# Patient Record
Sex: Female | Born: 1970
Health system: Southern US, Community
[De-identification: ages and names within clinical notes are randomized; demographics above are authoritative.]

## PROBLEM LIST (undated history)

## (undated) DIAGNOSIS — B019 Varicella without complication: Secondary | ICD-10-CM

## (undated) DIAGNOSIS — R42 Dizziness and giddiness: Secondary | ICD-10-CM

## (undated) DIAGNOSIS — E559 Vitamin D deficiency, unspecified: Secondary | ICD-10-CM

## (undated) DIAGNOSIS — E669 Obesity, unspecified: Secondary | ICD-10-CM

## (undated) HISTORY — DX: Obesity, unspecified: E66.9

## (undated) HISTORY — DX: Dizziness and giddiness: R42

## (undated) HISTORY — DX: Vitamin D deficiency, unspecified: E55.9

## (undated) HISTORY — DX: Varicella without complication: B01.9

---
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
## 1996-08-09 HISTORY — PX: CHOLECYSTECTOMY: SHX55

## 2008-08-09 HISTORY — PX: DILATION AND CURETTAGE OF UTERUS: SHX78

## 2009-08-09 HISTORY — PX: DILATION AND CURETTAGE OF UTERUS: SHX78

## 2015-12-25 ENCOUNTER — Ambulatory Visit (INDEPENDENT_AMBULATORY_CARE_PROVIDER_SITE_OTHER): Payer: 59 | Admitting: Family Medicine

## 2015-12-25 ENCOUNTER — Encounter: Payer: Self-pay | Admitting: Family Medicine

## 2015-12-25 VITALS — BP 130/82 | HR 60 | Temp 98.2°F | Ht 64.0 in | Wt 212.5 lb

## 2015-12-25 DIAGNOSIS — R6889 Other general symptoms and signs: Secondary | ICD-10-CM | POA: Diagnosis not present

## 2015-12-25 DIAGNOSIS — Z0001 Encounter for general adult medical examination with abnormal findings: Secondary | ICD-10-CM | POA: Diagnosis not present

## 2015-12-25 DIAGNOSIS — M25571 Pain in right ankle and joints of right foot: Secondary | ICD-10-CM | POA: Diagnosis not present

## 2015-12-25 DIAGNOSIS — R21 Rash and other nonspecific skin eruption: Secondary | ICD-10-CM

## 2015-12-25 DIAGNOSIS — E669 Obesity, unspecified: Secondary | ICD-10-CM | POA: Diagnosis not present

## 2015-12-25 NOTE — Patient Instructions (Signed)
We will call with your referrals (nutrition and dermatology).  Follow up annually or sooner if needed.  Take care  Dr. Lacinda Axon  Health Maintenance, Female Adopting a healthy lifestyle and getting preventive care can go a long way to promote health and wellness. Talk with your health care provider about what schedule of regular examinations is right for you. This is a good chance for you to check in with your provider about disease prevention and staying healthy. In between checkups, there are plenty of things you can do on your own. Experts have done a lot of research about which lifestyle changes and preventive measures are most likely to keep you healthy. Ask your health care provider for more information. WEIGHT AND DIET  Eat a healthy diet  Be sure to include plenty of vegetables, fruits, low-fat dairy products, and lean protein.  Do not eat a lot of foods high in solid fats, added sugars, or salt.  Get regular exercise. This is one of the most important things you can do for your health.  Most adults should exercise for at least 150 minutes each week. The exercise should increase your heart rate and make you sweat (moderate-intensity exercise).  Most adults should also do strengthening exercises at least twice a week. This is in addition to the moderate-intensity exercise.  Maintain a healthy weight  Body mass index (BMI) is a measurement that can be used to identify possible weight problems. It estimates body fat based on height and weight. Your health care provider can help determine your BMI and help you achieve or maintain a healthy weight.  For females 34 years of age and older:   A BMI below 18.5 is considered underweight.  A BMI of 18.5 to 24.9 is normal.  A BMI of 25 to 29.9 is considered overweight.  A BMI of 30 and above is considered obese.  Watch levels of cholesterol and blood lipids  You should start having your blood tested for lipids and cholesterol at 43  years of age, then have this test every 5 years.  You may need to have your cholesterol levels checked more often if:  Your lipid or cholesterol levels are high.  You are older than 43 years of age.  You are at high risk for heart disease.  CANCER SCREENING   Lung Cancer  Lung cancer screening is recommended for adults 64-58 years old who are at high risk for lung cancer because of a history of smoking.  A yearly low-dose CT scan of the lungs is recommended for people who:  Currently smoke.  Have quit within the past 15 years.  Have at least a 30-pack-year history of smoking. A pack year is smoking an average of one pack of cigarettes a day for 1 year.  Yearly screening should continue until it has been 15 years since you quit.  Yearly screening should stop if you develop a health problem that would prevent you from having lung cancer treatment.  Breast Cancer  Practice breast self-awareness. This means understanding how your breasts normally appear and feel.  It also means doing regular breast self-exams. Let your health care provider know about any changes, no matter how small.  If you are in your 20s or 30s, you should have a clinical breast exam (CBE) by a health care provider every 1-3 years as part of a regular health exam.  If you are 5 or older, have a CBE every year. Also consider having a breast X-ray (mammogram)  every year.  If you have a family history of breast cancer, talk to your health care provider about genetic screening.  If you are at high risk for breast cancer, talk to your health care provider about having an MRI and a mammogram every year.  Breast cancer gene (BRCA) assessment is recommended for women who have family members with BRCA-related cancers. BRCA-related cancers include:  Breast.  Ovarian.  Tubal.  Peritoneal cancers.  Results of the assessment will determine the need for genetic counseling and BRCA1 and BRCA2 testing. Cervical  Cancer Your health care provider may recommend that you be screened regularly for cancer of the pelvic organs (ovaries, uterus, and vagina). This screening involves a pelvic examination, including checking for microscopic changes to the surface of your cervix (Pap test). You may be encouraged to have this screening done every 3 years, beginning at age 71.  For women ages 27-65, health care providers may recommend pelvic exams and Pap testing every 3 years, or they may recommend the Pap and pelvic exam, combined with testing for human papilloma virus (HPV), every 5 years. Some types of HPV increase your risk of cervical cancer. Testing for HPV may also be done on women of any age with unclear Pap test results.  Other health care providers may not recommend any screening for nonpregnant women who are considered low risk for pelvic cancer and who do not have symptoms. Ask your health care provider if a screening pelvic exam is right for you.  If you have had past treatment for cervical cancer or a condition that could lead to cancer, you need Pap tests and screening for cancer for at least 20 years after your treatment. If Pap tests have been discontinued, your risk factors (such as having a new sexual partner) need to be reassessed to determine if screening should resume. Some women have medical problems that increase the chance of getting cervical cancer. In these cases, your health care provider may recommend more frequent screening and Pap tests. Colorectal Cancer  This type of cancer can be detected and often prevented.  Routine colorectal cancer screening usually begins at 43 years of age and continues through 43 years of age.  Your health care provider may recommend screening at an earlier age if you have risk factors for colon cancer.  Your health care provider may also recommend using home test kits to check for hidden blood in the stool.  A small camera at the end of a tube can be used to  examine your colon directly (sigmoidoscopy or colonoscopy). This is done to check for the earliest forms of colorectal cancer.  Routine screening usually begins at age 79.  Direct examination of the colon should be repeated every 5-10 years through 43 years of age. However, you may need to be screened more often if early forms of precancerous polyps or small growths are found. Skin Cancer  Check your skin from head to toe regularly.  Tell your health care provider about any new moles or changes in moles, especially if there is a change in a mole's shape or color.  Also tell your health care provider if you have a mole that is larger than the size of a pencil eraser.  Always use sunscreen. Apply sunscreen liberally and repeatedly throughout the day.  Protect yourself by wearing long sleeves, pants, a wide-brimmed hat, and sunglasses whenever you are outside. HEART DISEASE, DIABETES, AND HIGH BLOOD PRESSURE   High blood pressure causes heart disease and  increases the risk of stroke. High blood pressure is more likely to develop in:  People who have blood pressure in the high end of the normal range (130-139/85-89 mm Hg).  People who are overweight or obese.  People who are African American.  If you are 61-12 years of age, have your blood pressure checked every 3-5 years. If you are 49 years of age or older, have your blood pressure checked every year. You should have your blood pressure measured twice--once when you are at a hospital or clinic, and once when you are not at a hospital or clinic. Record the average of the two measurements. To check your blood pressure when you are not at a hospital or clinic, you can use:  An automated blood pressure machine at a pharmacy.  A home blood pressure monitor.  If you are between 72 years and 80 years old, ask your health care provider if you should take aspirin to prevent strokes.  Have regular diabetes screenings. This involves taking a  blood sample to check your fasting blood sugar level.  If you are at a normal weight and have a low risk for diabetes, have this test once every three years after 43 years of age.  If you are overweight and have a high risk for diabetes, consider being tested at a younger age or more often. PREVENTING INFECTION  Hepatitis B  If you have a higher risk for hepatitis B, you should be screened for this virus. You are considered at high risk for hepatitis B if:  You were born in a country where hepatitis B is common. Ask your health care provider which countries are considered high risk.  Your parents were born in a high-risk country, and you have not been immunized against hepatitis B (hepatitis B vaccine).  You have HIV or AIDS.  You use needles to inject street drugs.  You live with someone who has hepatitis B.  You have had sex with someone who has hepatitis B.  You get hemodialysis treatment.  You take certain medicines for conditions, including cancer, organ transplantation, and autoimmune conditions. Hepatitis C  Blood testing is recommended for:  Everyone born from [birth year] through 1963.  Anyone with known risk factors for hepatitis C. Sexually transmitted infections (STIs)  You should be screened for sexually transmitted infections (STIs) including gonorrhea and chlamydia if:  You are sexually active and are younger than 43 years of age.  You are older than 43 years of age and your health care provider tells you that you are at risk for this type of infection.  Your sexual activity has changed since you were last screened and you are at an increased risk for chlamydia or gonorrhea. Ask your health care provider if you are at risk.  If you do not have HIV, but are at risk, it may be recommended that you take a prescription medicine daily to prevent HIV infection. This is called pre-exposure prophylaxis (PrEP). You are considered at risk if:  You are sexually active and do  not regularly use condoms or know the HIV status of your partner(s).  You take drugs by injection.  You are sexually active with a partner who has HIV. Talk with your health care provider about whether you are at high risk of being infected with HIV. If you choose to begin PrEP, you should first be tested for HIV. You should then be tested every 3 months for as long as you are taking PrEP.  PREGNANCY   If you are premenopausal and you may become pregnant, ask your health care provider about preconception counseling.  If you may become pregnant, take 400 to 800 micrograms (mcg) of folic acid every day.  If you want to prevent pregnancy, talk to your health care provider about birth control (contraception). OSTEOPOROSIS AND MENOPAUSE   Osteoporosis is a disease in which the bones lose minerals and strength with aging. This can result in serious bone fractures. Your risk for osteoporosis can be identified using a bone density scan.  If you are 49 years of age or older, or if you are at risk for osteoporosis and fractures, ask your health care provider if you should be screened.  Ask your health care provider whether you should take a calcium or vitamin D supplement to lower your risk for osteoporosis.  Menopause may have certain physical symptoms and risks.  Hormone replacement therapy may reduce some of these symptoms and risks. Talk to your health care provider about whether hormone replacement therapy is right for you.  HOME CARE INSTRUCTIONS   Schedule regular health, dental, and eye exams.  Stay current with your immunizations.   Do not use any tobacco products including cigarettes, chewing tobacco, or electronic cigarettes.  If you are pregnant, do not drink alcohol.  If you are breastfeeding, limit how much and how often you drink alcohol.  Limit alcohol intake to no more than 1 drink per day for nonpregnant women. One drink equals 12 ounces of beer, 5 ounces of wine, or 1  ounces of hard liquor.  Do not use street drugs.  Do not share needles.  Ask your health care provider for help if you need support or information about quitting drugs.  Tell your health care provider if you often feel depressed.  Tell your health care provider if you have ever been abused or do not feel safe at home.   This information is not intended to replace advice given to you by your health care provider. Make sure you discuss any questions you have with your health care provider.   Document Released: 02/08/2011 Document Revised: 08/16/2014 Document Reviewed: 06/27/2013 Elsevier Interactive Patient Education [employer].

## 2015-12-25 NOTE — Progress Notes (Signed)
Pre visit review using our clinic review tool, if applicable. No additional management support is needed unless otherwise documented below in the visit note. 

## 2015-12-26 ENCOUNTER — Encounter: Payer: Self-pay | Admitting: Family Medicine

## 2015-12-26 DIAGNOSIS — R21 Rash and other nonspecific skin eruption: Secondary | ICD-10-CM | POA: Insufficient documentation

## 2015-12-26 DIAGNOSIS — M25579 Pain in unspecified ankle and joints of unspecified foot: Secondary | ICD-10-CM | POA: Insufficient documentation

## 2015-12-26 DIAGNOSIS — Z0001 Encounter for general adult medical examination with abnormal findings: Secondary | ICD-10-CM | POA: Insufficient documentation

## 2015-12-26 NOTE — Progress Notes (Signed)
Subjective:  Patient ID: Nancy Donovan, female    DOB: 06-Jul-1971  Age: 45 y.o. MRN: [id number]  CC: Establish care, Rash, Ankle pain, Discuss weight  HPI Nancy Donovan is a 45 y.o. female presents to the clinic today to Establish care. Additional concerns are outlined below.  Preventative Healthcare  Pap smear: Up-to-date. Performed by [facility] in 2016.  Mammogram: Up-to-date. Done this month. Has not received information back.  Colonoscopy: N/A.  Immunizations  Tetanus - up-to-date.  Pneumococcal - N/A  Flu - N/A at this time.  Zoster - N/A  Labs: Has had recent screening labs at her OB/GYN's office. Need records.  Exercise: Trying to exercise but is finding it difficult due to schedule/children.  Alcohol use: See below.  Smoking/tobacco use: Nonsmoker.  Regular dental exams: Yes.   Wears seat belt: Yes.  Ankle pain - right  Has been going on for several weeks.   Pain is located just posterior to the lateral malleolus.  She states that it feels "tight".  She has been stretching with some improvement.  No known inciting factor.  Exacerbated by activity.  No other interventions tried.   Rash  Patient reports recent development of "red patches" on her arms.  She states that it happens when they're exposed to water.  No associated itching.  No new exposures or changes in skin care products.  She states that it is annoying and she wanted to go away.  She does note that her skin has been dry.  No known relieving factors other than not exposing to water.  PMH, Surgical Hx, Family Hx, Social History reviewed and updated as below.  Past Medical History  Diagnosis Date  . Chicken pox    Past Surgical History  Procedure Laterality Date  . Eye surgery  1978  . Cesarean section  2008  . Cesarean section  2011    repeat c-seaction  . Dilation and curettage of uterus  2010  . Dilation and curettage of uterus  2011  . Cholecystectomy  1998    Family History  Problem Relation Age of Onset  . Hypertension Mother   . Hypertension Father   . Diabetes Mother      Social History  Substance Use Topics  . Smoking status: Never Smoker   . Smokeless tobacco: Never Used  . Alcohol Use: 0.0 - 0.6 oz/week    0-1 Standard drinks or equivalent per week   Review of Systems  Musculoskeletal:       Ankle pain.  Skin: Positive for rash.  Psychiatric/Behavioral:       Emotional (crys often).  All other systems reviewed and are negative.  Objective:   Today's Vitals: BP 130/82 mmHg  Pulse 60  Temp(Src) 98.2 F (36.8 C) (Oral)  Ht  (1.626 m)  Wt 212 lb 8 oz (96.389 kg)  BMI 36.46 kg/m2  SpO2 97%  LMP 12/08/2015 (Approximate)  Physical Exam  Constitutional: She is oriented to person, place, and time. She appears well-developed. No distress.  HENT:  Head: Normocephalic and atraumatic.  Mouth/Throat: Oropharynx is clear and moist.  Eyes: Conjunctivae are normal. No scleral icterus.  Neck: Normal range of motion. Neck supple.  Cardiovascular: Normal rate and regular rhythm.   No murmur heard. Pulmonary/Chest: Effort normal and breath sounds normal. She has no wheezes. She has no rales.  Abdominal: Soft. She exhibits no distension. There is no tenderness. There is no rebound and no guarding.  Musculoskeletal:  Right ankle - no areas  of tenderness. Normal range of motion. No swelling or effusion.  Neurological: She is alert and oriented to person, place, and time.  Skin:  Arms and legs with dry skin. Evidence of sun damage noted on the forearms. No discrete rash.  Psychiatric: She has a normal mood and affect.  Vitals reviewed.  Assessment & Plan:   Problem List Items Addressed This Visit    Rash    Patient was reported red areas/patches on her upper arms. Other than dry skin and evidence of sun damage, no rash was noted today. Sending to dermatology per patient request.      Relevant Orders   Ambulatory  referral to Dermatology   Encounter for preventative adult health care exam with abnormal findings - Primary    Pap smear up-to-date. Mammogram up-to-date. Tetanus up-to-date. Has had recent screening labs. Discussed her weight today as she wants to lose weight and is finding it difficult. Advised high-protein low-carb diet and regular exercise. Discussed nutrition referral and she would like to proceed with this.      Ankle pain    Exam unremarkable. Advised supportive care and good supportive shoes. No need for further intervention at this time.       Other Visit Diagnoses    Obesity (BMI 30-39.9)        Relevant Orders    Amb ref to Medical Nutrition Therapy-MNT      Follow-up: Annually or sooner if needed.  Everlene OtherJayce Samai Corea DO [facility] [place]

## 2015-12-26 NOTE — Assessment & Plan Note (Signed)
Patient was reported red areas/patches on her upper arms. Other than dry skin and evidence of sun damage, no rash was noted today. Sending to dermatology per patient request.

## 2015-12-26 NOTE — Assessment & Plan Note (Signed)
Exam unremarkable. Advised supportive care and good supportive shoes. No need for further intervention at this time.

## 2015-12-26 NOTE — Assessment & Plan Note (Signed)
Pap smear up-to-date. Mammogram up-to-date. Tetanus up-to-date. Has had recent screening labs. Discussed her weight today as she wants to lose weight and is finding it difficult. Advised high-protein low-carb diet and regular exercise. Discussed nutrition referral and she would like to proceed with this.

## 2016-02-05 ENCOUNTER — Encounter: Payer: Self-pay | Admitting: Dietician

## 2016-02-05 ENCOUNTER — Encounter: Payer: 59 | Attending: Family Medicine | Admitting: Dietician

## 2016-02-05 VITALS — Ht 63.0 in | Wt 213.0 lb

## 2016-02-05 DIAGNOSIS — E669 Obesity, unspecified: Secondary | ICD-10-CM

## 2016-02-05 NOTE — Progress Notes (Signed)
Medical Nutrition Therapy: Visit start time: 1330  end time: 1430  Assessment:  Diagnosis: obesity Past medical history: vertigo Psychosocial issues/ stress concerns: none Preferred learning method:  . Hands-on  Current weight: 213lbs  Height: 5'3" Medications, supplements: taking Meclizine for vertigo Progress and evaluation: Patient reports participating in weight watchers program in the past, with success (in her 30s)        Now trying again to lose weight, and having more difficulty. Has phone app to track exercise and intake.    Physical activity: none  Dietary Intake:  Usual eating pattern includes 3 meals and 1-2 snacks per day. Dining out frequency: 2 meals per week.  Breakfast: peanut butter toast, apple; omelet with cheese; occasionally bigger meal on weekends. Diet mt dew, usually 1 per day.  Snack: sometimes peanut butter crackers or snack bar, cheese stick, fruit when available.  Lunch: sandwich with [place] and cheese, chips.  Snack:  usually none; occasionally apple or cheese stick, rarely sweets like cookies or ice cream. Supper: spaghetti, burgers or hot dogs, tacos, meat loaf with a starch, vegetable (kids wont eat veg), usually easy-to-cook meals.  Snack: occasionally popcorn on "movie night". sometimes cookie or ice cream after dinner.  Beverages: Diet Mt. Dew (1), Crystal light lemonade, sometimes 1/2-sweet tea, water  Nutrition Care Education: Topics covered: weight management Basic nutrition: basic food groups, appropriate nutrient balance, appropriate meal and snack schedule, general nutrition guidelines    Weight control: behavioral changes for weight loss: portion control, importance of low fat and low sugar foods, guide for 1400kcal daily intake Other lifestyle changes:  benefits of regular exercise, tracking food intake.  Nutritional Diagnosis:  [place]-3.3 Overweight/obesity As related to inactivity, excess calories.  As evidenced by patient  report.  Intervention: Instruction as noted above.   Set goals with patient input.  Education Materials given:  . Food lists/ Planning A Balanced Meal . Sample meal pattern/ menus: Quick and healthy Meal Ideas . Goals/ instructions  Learner/ who was taught:  . Patient   Level of understanding: Marland Kitchen. Verbalizes/ demonstrates competency  Demonstrated degree of understanding via:   Teach back Learning barriers: . None  Willingness to learn/ readiness for change: . Eager, change in progress  Monitoring and Evaluation:  Dietary intake, exercise, and body weight      follow up: 03/15/16

## 2016-02-05 NOTE — Patient Instructions (Signed)
   Plan balanced meals, including large vegetable portions, while controlling portions of starches and meats.   Track food intake using your phone app.; aim for about 1400 calories a day on average.   Resume some exercise -- start with a manageable time, such as 15 minutes, and gradually increase as your stamina increases.

## 2016-03-15 ENCOUNTER — Ambulatory Visit: Payer: 59 | Admitting: Dietician

## 2017-02-14 ENCOUNTER — Ambulatory Visit (INDEPENDENT_AMBULATORY_CARE_PROVIDER_SITE_OTHER): Payer: 59 | Admitting: Family Medicine

## 2017-02-14 ENCOUNTER — Encounter: Payer: Self-pay | Admitting: Family Medicine

## 2017-02-14 VITALS — BP 126/76 | HR 88 | Temp 98.2°F | Resp 16 | Ht 63.0 in | Wt 216.8 lb

## 2017-02-14 DIAGNOSIS — E669 Obesity, unspecified: Secondary | ICD-10-CM

## 2017-02-14 DIAGNOSIS — Z0000 Encounter for general adult medical examination without abnormal findings: Secondary | ICD-10-CM

## 2017-02-14 NOTE — Patient Instructions (Signed)

## 2017-02-15 ENCOUNTER — Encounter: Payer: Self-pay | Admitting: Family Medicine

## 2017-02-15 DIAGNOSIS — Z0000 Encounter for general adult medical examination without abnormal findings: Secondary | ICD-10-CM | POA: Insufficient documentation

## 2017-02-15 DIAGNOSIS — E669 Obesity, unspecified: Secondary | ICD-10-CM | POA: Insufficient documentation

## 2017-02-15 DIAGNOSIS — Z0001 Encounter for general adult medical examination with abnormal findings: Secondary | ICD-10-CM | POA: Insufficient documentation

## 2017-02-15 LAB — COMPREHENSIVE METABOLIC PANEL
ALBUMIN: 4.2 g/dL (ref 3.5–5.2)
ALK PHOS: 65 U/L (ref 39–117)
ALT: 20 U/L (ref 0–35)
AST: 13 U/L (ref 0–37)
BUN: 15 mg/dL (ref 6–23)
CALCIUM: 10.1 mg/dL (ref 8.4–10.5)
CO2: 29 mEq/L (ref 19–32)
Chloride: 101 mEq/L (ref 96–112)
Creatinine, Ser: 0.94 mg/dL (ref 0.40–1.20)
GFR: 68.72 mL/min (ref >60.00)
Glucose, Bld: 92 mg/dL (ref 70–99)
POTASSIUM: 4.1 meq/L (ref 3.5–5.1)
Sodium: 139 mEq/L (ref 135–145)
Total Bilirubin: 0.4 mg/dL (ref 0.2–1.2)
Total Protein: 7.5 g/dL (ref 6.0–8.3)

## 2017-02-15 LAB — CBC
HEMATOCRIT: 41 % (ref 36.0–46.0)
HEMOGLOBIN: 14.1 g/dL (ref 12.0–15.0)
MCHC: 34.3 g/dL (ref 30.0–36.0)
MCV: 86.5 fl (ref 78.0–100.0)
PLATELETS: 278 10*3/uL (ref 150.0–400.0)
RBC: 4.74 Mil/uL (ref 3.87–5.11)
RDW: 13 % (ref 11.5–15.5)
WBC: 8.1 10*3/uL (ref 4.0–10.5)

## 2017-02-15 LAB — LIPID PANEL
CHOLESTEROL: 155 mg/dL (ref 0–200)
HDL: 44.4 mg/dL (ref >39.00)
LDL Cholesterol: 79 mg/dL (ref 0–99)
NonHDL: 110.48
Total CHOL/HDL Ratio: 3
Triglycerides: 159 mg/dL — ABNORMAL HIGH (ref 0.0–149.0)
VLDL: 31.8 mg/dL (ref 0.0–40.0)

## 2017-02-15 LAB — TSH: TSH: 1.76 u[IU]/mL (ref 0.35–4.50)

## 2017-02-15 LAB — HEMOGLOBIN A1C: Hgb A1c MFr Bld: 5.2 % (ref 4.6–6.5)

## 2017-02-15 NOTE — Assessment & Plan Note (Signed)
Preventative health care up to date. Sees GYN regarding pap smears and mammogram. Lengthy discussion today about weight loss and dietary changes.

## 2017-02-15 NOTE — Progress Notes (Signed)
   Subjective:  Patient ID: Nancy Donovan, female    DOB: 22-Nov-1970  Age: 46 y.o. MRN: [id number]  CC: Annual exam  HPI Nancy Donovan is a 46 y.o. female presents to the clinic today for an annual physical exam.  Preventative Healthcare  Pap smear: Up to date.  Mammogram: Up to date.  Colonoscopy: Not indicated.   Immunizations: Up to date.   Labs: Screening labs today.  Exercise: Trying to exercise regularly.  Smoking/tobacco use: Former.  STD/HIV testing: Done previously.  PMH, Surgical Hx, Family Hx, Social History reviewed and updated as below.  Past Medical History:  Diagnosis Date  . Chicken pox   . Obesity (BMI 30-39.9)    Past Surgical History:  Procedure Laterality Date  . CESAREAN SECTION  2008  . CESAREAN SECTION  2011   repeat c-seaction  . CHOLECYSTECTOMY  1998  . DILATION AND CURETTAGE OF UTERUS  2010  . DILATION AND CURETTAGE OF UTERUS  2011  . EYE SURGERY  1978   Family History  Problem Relation Age of Onset  . Hypertension Mother   . Diabetes Mother   . Hypertension Father    Social History  Substance Use Topics  . Smoking status: Former Games developermoker  . Smokeless tobacco: Never Used  . Alcohol use 0.0 - 0.6 oz/week   Review of Systems  Musculoskeletal: Positive for back pain.  All other systems reviewed and are negative.  Objective:   Today's Vitals: BP 126/76   Pulse 88   Temp 98.2 F (36.8 C) (Oral)   Resp 16   Ht 5\' 3"  (1.6 m)   Wt 216 lb 12.8 oz (98.3 kg)   LMP 01/30/2017   SpO2 99%   BMI 38.40 kg/m   Physical Exam  Constitutional: She is oriented to person, place, and time. She appears well-developed and well-nourished. No distress.  HENT:  Head: Normocephalic and atraumatic.  Nose: Nose normal.  Mouth/Throat: Oropharynx is clear and moist. No oropharyngeal exudate.  Normal TM's bilaterally.   Eyes: Conjunctivae are normal. No scleral icterus.  Neck: Neck supple.  Cardiovascular: Normal rate and regular rhythm.   No  murmur heard. Pulmonary/Chest: Effort normal and breath sounds normal. She has no wheezes. She has no rales.  Abdominal: Soft. She exhibits no distension. There is no tenderness. There is no rebound and no guarding.  Musculoskeletal: Normal range of motion. She exhibits no edema.  Lymphadenopathy:    She has no cervical adenopathy.  Neurological: She is alert and oriented to person, place, and time.  Skin: Skin is warm and dry. No rash noted.  Psychiatric: She has a normal mood and affect.  Vitals reviewed.  Assessment & Plan:   Problem List Items Addressed This Visit      Other   Annual physical exam - Primary    Preventative health care up to date. Sees GYN regarding pap smears and mammogram. Lengthy discussion today about weight loss and dietary changes.      Relevant Orders   CBC   Hemoglobin A1c   Comprehensive metabolic panel   Lipid panel   TSH   Obesity (BMI 30-39.9)     Follow-up: Return in about 1 year (around 02/14/2018).  Everlene OtherJayce Toriann Spadoni DO [facility] [place]

## 2017-02-17 ENCOUNTER — Telehealth: Payer: Self-pay | Admitting: *Deleted

## 2017-02-17 NOTE — Telephone Encounter (Signed)
Patient notified of lab results see documentation on lab results

## 2017-02-17 NOTE — Telephone Encounter (Signed)
Pt has requested lab results  Pt contact [phone]

## 2017-06-22 ENCOUNTER — Encounter: Payer: Self-pay | Admitting: Family Medicine

## 2017-06-22 ENCOUNTER — Ambulatory Visit (INDEPENDENT_AMBULATORY_CARE_PROVIDER_SITE_OTHER): Payer: 59 | Admitting: Family Medicine

## 2017-06-22 VITALS — BP 110/76 | HR 78 | Temp 97.9°F | Wt 218.4 lb

## 2017-06-22 DIAGNOSIS — K649 Unspecified hemorrhoids: Secondary | ICD-10-CM | POA: Diagnosis not present

## 2017-06-22 MED ORDER — HYDROCORTISONE 2.5 % RE CREA: 1.0000 "application " | TOPICAL_CREAM | Freq: Two times a day (BID) | RECTAL | 0 refills | Status: DC

## 2017-06-22 NOTE — Patient Instructions (Signed)
It was a pleasure to see you today!  -Please increase fiber and water intake -Continue miralax as needed -Follow up if symptoms do not improve in one week or sooner if needed.   Hemorrhoids Hemorrhoids are swollen veins in and around the rectum or anus. Hemorrhoids can cause pain, itching, or bleeding. Most of the time, they do not cause serious problems. They usually get better with diet changes, lifestyle changes, and other home treatments. Follow these instructions at home: Eating and drinking  Eat foods that have fiber, such as whole grains, beans, nuts, fruits, and vegetables. Ask your doctor about taking products that have added fiber (fibersupplements).  Drink enough fluid to keep your pee (urine) clear or pale yellow. For Pain and Swelling  Take a warm-water bath (sitz bath) for 20 minutes to ease pain. Do this 3-4 times a day.  If directed, put ice on the painful area. It may be helpful to use ice between your warm baths. ? Put ice in a plastic bag. ? Place a towel between your skin and the bag. ? Leave the ice on for 20 minutes, 2-3 times a day. General instructions  Take over-the-counter and prescription medicines only as told by your doctor. ? Medicated creams and medicines that are inserted into the anus (suppositories) may be used or applied as told.  Exercise often.  Go to the bathroom when you have the urge to poop (to have a bowel movement). Do not wait.  Avoid pushing too hard (straining) when you poop.  Keep the butt area dry and clean. Use wet toilet paper or moist paper towels.  Do not sit on the toilet for a long time. Contact a doctor if:  You have any of these: ? Pain and swelling that do not get better with treatment or medicine. ? Bleeding that will not stop. ? Trouble pooping or you cannot poop. ? Pain or swelling outside the area of the hemorrhoids. This information is not intended to replace advice given to you by your health care provider.  Make sure you discuss any questions you have with your health care provider. Document Released: 05/04/2008 Document Revised: 01/01/2016 Document Reviewed: 04/09/2015 Elsevier Interactive Patient Education  [employer]2018 Elsevier Inc[employer]

## 2017-06-22 NOTE — Progress Notes (Signed)
Subjective:    Patient ID: Nancy Donovan, female    DOB: 07-Oct-1970, 46 y.o.   MRN: [id number]  HPI  Ms. Nancy Donovan is a 46 year old female who presents with a hemorrhoid that has been present for 6 days. This is a new problem. She reports recently changing her diet by eating more protein and avoiding carbohydrates which has contributed to constipation. She had recent constipation due to this change in her diet. She has started miralax 1/2 capful every other day which has improved her constipation and BMs are now soft and formed Associated itching and discomfort noted. No bleeding noted. No blood in her stool. Denies fever, night sweats, weight loss, abdominal pain, or pain with defecation present. Treatment with OTC preparation H has provided limited benefit.  Review of Systems  Constitutional: Negative for activity change, appetite change, chills, fatigue and fever.  Respiratory: Negative for cough, shortness of breath and wheezing.   Cardiovascular: Negative for chest pain and palpitations.  Gastrointestinal: Negative for abdominal pain, diarrhea, nausea and vomiting.       Hemorrhoid present  Musculoskeletal: Negative for myalgias.  Skin: Negative for rash.  Neurological: Negative for dizziness, light-headedness and headaches.   Past Medical History:  Diagnosis Date  . Chicken pox   . Obesity (BMI 30-39.9)      Social History   Socioeconomic History  . Marital status: Married    Spouse name: Not on file  . Number of children: Not on file  . Years of education: Not on file  . Highest education level: Not on file  Social Needs  . Financial resource strain: Not on file  . Food insecurity - worry: Not on file  . Food insecurity - inability: Not on file  . Transportation needs - medical: Not on file  . Transportation needs - non-medical: Not on file  Occupational History  . Not on file  Tobacco Use  . Smoking status: Former Games developermoker  . Smokeless tobacco: Never Used  Substance  and Sexual Activity  . Alcohol use: Yes    Alcohol/week: 0.0 - 0.6 oz  . Drug use: No  . Sexual activity: Yes    Partners: Male  Other Topics Concern  . Not on file  Social History Narrative  . Not on file    Past Surgical History:  Procedure Laterality Date  . CESAREAN SECTION  2008  . CESAREAN SECTION  2011   repeat c-seaction  . CHOLECYSTECTOMY  1998  . DILATION AND CURETTAGE OF UTERUS  2010  . DILATION AND CURETTAGE OF UTERUS  2011  . EYE SURGERY  1978    Family History  Problem Relation Age of Onset  . Hypertension Mother   . Diabetes Mother   . Hypertension Father     No Known Allergies  Current Outpatient Medications on File Prior to Visit  Medication Sig Dispense Refill  . fexofenadine (ALLEGRA) 30 MG tablet Take 30 mg 2 (two) times daily by mouth.    . meclizine (ANTIVERT) 25 MG tablet Take 25 mg by mouth 3 (three) times daily as needed for dizziness (takes 1/2 tab at a time).     No current facility-administered medications on file prior to visit.     BP 110/76   Pulse 78   Temp 97.9 F (36.6 C) (Oral)   Wt 218 lb 6.4 oz (99.1 kg)   LMP 06/21/2017   SpO2 95%   BMI 38.69 kg/m        Objective:  Physical Exam  Constitutional: She is oriented to person, place, and time. She appears well-developed and well-nourished.  Eyes: Pupils are equal, round, and reactive to light. No scleral icterus.  Neck: Neck supple.  Cardiovascular: Normal rate, regular rhythm and intact distal pulses.  Pulmonary/Chest: Effort normal and breath sounds normal. She has no wheezes. She has no rales.  Abdominal: Soft. Bowel sounds are normal. There is no tenderness.  Genitourinary:  Genitourinary Comments: External hemorrhoid noted; not thrombosed; no internal hemorrhoid palpated on DRE.  Lymphadenopathy:    She has no cervical adenopathy.  Neurological: She is alert and oriented to person, place, and time. Coordination normal.  Skin: Skin is warm and dry. No rash noted.   Psychiatric:  Denies depressed or anxious mood       Assessment & Plan:  1. Hemorrhoids, unspecified hemorrhoid type External hemorrhoid noted. No thrombosed hemorrhoid present. Advised conservative treatment of increasing fiber and water intake. Miralax prn for constipation. We discussed that the recent increase in protein in her diet is most likely what contributed to her constipation. Provided hydrocortisone 2.5% cream BID for symptom relief. We discussed that this should resolve in 7 to 10 days. If symptoms persist, follow up with PCP for further evaluation and treatment.  Return written precautions provided.  Roddie McJulia Vontrell Pullman, FNP-C

## 2017-07-25 DIAGNOSIS — J0100 Acute maxillary sinusitis, unspecified: Secondary | ICD-10-CM | POA: Diagnosis not present

## 2017-09-13 DIAGNOSIS — J310 Chronic rhinitis: Secondary | ICD-10-CM | POA: Diagnosis not present

## 2017-09-13 DIAGNOSIS — J342 Deviated nasal septum: Secondary | ICD-10-CM | POA: Diagnosis not present

## 2017-09-13 DIAGNOSIS — J343 Hypertrophy of nasal turbinates: Secondary | ICD-10-CM | POA: Diagnosis not present

## 2018-02-17 ENCOUNTER — Encounter: Payer: 59 | Admitting: Family Medicine

## 2018-06-02 ENCOUNTER — Encounter: Payer: 59 | Admitting: Family Medicine

## 2018-06-02 ENCOUNTER — Encounter

## 2018-06-20 ENCOUNTER — Ambulatory Visit (INDEPENDENT_AMBULATORY_CARE_PROVIDER_SITE_OTHER): Payer: 59 | Admitting: Family Medicine

## 2018-06-20 ENCOUNTER — Encounter: Payer: Self-pay | Admitting: Family Medicine

## 2018-06-20 VITALS — BP 130/84 | HR 74 | Temp 98.2°F | Ht 63.0 in | Wt 223.8 lb

## 2018-06-20 DIAGNOSIS — E669 Obesity, unspecified: Secondary | ICD-10-CM

## 2018-06-20 DIAGNOSIS — Z13220 Encounter for screening for lipoid disorders: Secondary | ICD-10-CM

## 2018-06-20 DIAGNOSIS — L989 Disorder of the skin and subcutaneous tissue, unspecified: Secondary | ICD-10-CM

## 2018-06-20 DIAGNOSIS — Z0001 Encounter for general adult medical examination with abnormal findings: Secondary | ICD-10-CM | POA: Diagnosis not present

## 2018-06-20 DIAGNOSIS — Z0100 Encounter for examination of eyes and vision without abnormal findings: Secondary | ICD-10-CM

## 2018-06-20 DIAGNOSIS — Z130 Encounter for screening for diseases of the blood and blood-forming organs and certain disorders involving the immune mechanism: Secondary | ICD-10-CM | POA: Diagnosis not present

## 2018-06-20 DIAGNOSIS — Z1329 Encounter for screening for other suspected endocrine disorder: Secondary | ICD-10-CM | POA: Diagnosis not present

## 2018-06-20 DIAGNOSIS — Z01419 Encounter for gynecological examination (general) (routine) without abnormal findings: Secondary | ICD-10-CM

## 2018-06-20 DIAGNOSIS — Z23 Encounter for immunization: Secondary | ICD-10-CM | POA: Diagnosis not present

## 2018-06-20 DIAGNOSIS — H6121 Impacted cerumen, right ear: Secondary | ICD-10-CM

## 2018-06-20 LAB — COMPREHENSIVE METABOLIC PANEL
ALK PHOS: 79 U/L (ref 39–117)
ALT: 24 U/L (ref 0–35)
AST: 17 U/L (ref 0–37)
Albumin: 4.1 g/dL (ref 3.5–5.2)
BILIRUBIN TOTAL: 0.5 mg/dL (ref 0.2–1.2)
BUN: 12 mg/dL (ref 6–23)
CALCIUM: 9.8 mg/dL (ref 8.4–10.5)
CO2: 30 mEq/L (ref 19–32)
Chloride: 100 mEq/L (ref 96–112)
Creatinine, Ser: 0.87 mg/dL (ref 0.40–1.20)
GFR: 74.68 mL/min (ref >60.00)
GLUCOSE: 89 mg/dL (ref 70–99)
POTASSIUM: 4.2 meq/L (ref 3.5–5.1)
Sodium: 139 mEq/L (ref 135–145)
TOTAL PROTEIN: 7.1 g/dL (ref 6.0–8.3)

## 2018-06-20 LAB — CBC
HCT: 39.5 % (ref 36.0–46.0)
Hemoglobin: 13.6 g/dL (ref 12.0–15.0)
MCHC: 34.4 g/dL (ref 30.0–36.0)
MCV: 85.9 fl (ref 78.0–100.0)
Platelets: 294 10*3/uL (ref 150.0–400.0)
RBC: 4.6 Mil/uL (ref 3.87–5.11)
RDW: 13.5 % (ref 11.5–15.5)
WBC: 8.6 10*3/uL (ref 4.0–10.5)

## 2018-06-20 LAB — LIPID PANEL
CHOL/HDL RATIO: 4
Cholesterol: 166 mg/dL (ref 0–200)
HDL: 45 mg/dL (ref >39.00)
LDL Cholesterol: 89 mg/dL (ref 0–99)
NonHDL: 121.15
TRIGLYCERIDES: 160 mg/dL — AB (ref 0.0–149.0)
VLDL: 32 mg/dL (ref 0.0–40.0)

## 2018-06-20 LAB — HEMOGLOBIN A1C: HEMOGLOBIN A1C: 5.2 % (ref 4.6–6.5)

## 2018-06-20 LAB — TSH: TSH: 2.93 u[IU]/mL (ref 0.35–4.50)

## 2018-06-20 NOTE — Progress Notes (Signed)
Nancy Rumps, MD Phone: [phone]  Nancy Donovan is a 47 y.o. female who presents today for cpe.  Not exercising.  She plans to start using a treadmill next year. She eats lean meats and a regular diet.  Not as many vegetables as she should.  2-3 12 ounce sodas per day. Reports her Pap smear is due.  She would like a referral to gynecology.  She has menses once monthly.  Last for about 4 days.  She notes breast sensitivity recently with her menses and moodiness the week before.  Resolves when her menses resolves. Mammogram is due. No family history of colon cancer, breast cancer, or ovarian cancer. Tetanus vaccination up-to-date.  Due for flu vaccination. HIV screening up-to-date. No tobacco use or illicit drug use.  Occasional alcohol use. Notes she is due for ophthalmology.  She had surgery on her right eye when she was 47 years old and it is unchanged.  Her reports little vision in this eye. Sees a dentist twice yearly. She reports having trouble hearing out of her right ear.  This started after she had an upper respiratory infection.  Feels like she is in a tunnel.  Her upper respiratory infection resolved. She reports a new skin lesion on the dorsal part of her left forearm.  Notes it is not been healing.   Active Ambulatory Problems    Diagnosis Date Noted  . Obesity (BMI 30-39.9)   . Encounter for general adult medical examination with abnormal findings 02/15/2017  . Skin lesion 06/21/2018  . Cerumen impaction 06/21/2018   Resolved Ambulatory Problems    Diagnosis Date Noted  . Encounter for preventative adult health care exam with abnormal findings 12/26/2015  . Ankle pain 12/26/2015  . Rash 12/26/2015   Past Medical History:  Diagnosis Date  . Chicken pox     Family History  Problem Relation Age of Onset  . Hypertension Mother   . Diabetes Mother   . Hypertension Father     Social History   Socioeconomic History  . Marital status: Married    Spouse name:  Not on file  . Number of children: Not on file  . Years of education: Not on file  . Highest education level: Not on file  Occupational History  . Not on file  Social Needs  . Financial resource strain: Not on file  . Food insecurity:    Worry: Not on file    Inability: Not on file  . Transportation needs:    Medical: Not on file    Non-medical: Not on file  Tobacco Use  . Smoking status: Former Research scientist (life sciences)  . Smokeless tobacco: Never Used  Substance and Sexual Activity  . Alcohol use: Yes    Alcohol/week: 0.0 - 1.0 standard drinks  . Drug use: No  . Sexual activity: Yes    Partners: Male  Lifestyle  . Physical activity:    Days per week: Not on file    Minutes per session: Not on file  . Stress: Not on file  Relationships  . Social connections:    Talks on phone: Not on file    Gets together: Not on file    Attends religious service: Not on file    Active member of club or organization: Not on file    Attends meetings of clubs or organizations: Not on file    Relationship status: Not on file  . Intimate partner violence:    Fear of current or ex partner: Not on  file    Emotionally abused: Not on file    Physically abused: Not on file    Forced sexual activity: Not on file  Other Topics Concern  . Not on file  Social History Narrative  . Not on file    ROS  General:  Negative for nexplained weight loss, fever Skin: Positive for new or changing mole, negative for sore that won't heal HEENT: Negative for trouble hearing, trouble seeing, ringing in ears, mouth sores, hoarseness, change in voice, dysphagia. CV:  Negative for chest pain, dyspnea, edema, palpitations Resp: Negative for cough, dyspnea, hemoptysis GI: Negative for nausea, vomiting, diarrhea, constipation, abdominal pain, melena, hematochezia. GU: Negative for dysuria, incontinence, urinary hesitance, hematuria, vaginal or penile discharge, polyuria, sexual difficulty, lumps in testicle or breasts MSK:  Negative for muscle cramps or aches, joint pain or swelling Neuro: Negative for headaches, weakness, numbness, dizziness, passing out/fainting Psych: Negative for depression, anxiety, memory problems  Objective  Physical Exam Vitals:   06/20/18 1034  BP: 130/84  Pulse: 74  Temp: 98.2 F (36.8 C)  SpO2: 98%    BP Readings from Last 3 Encounters:  06/20/18 130/84  06/22/17 110/76  02/14/17 126/76   Wt Readings from Last 3 Encounters:  06/20/18 223 lb 12.8 oz (101.5 kg)  06/22/17 218 lb 6.4 oz (99.1 kg)  02/14/17 216 lb 12.8 oz (98.3 kg)    Physical Exam  Constitutional: No distress.  HENT:  Head: Normocephalic and atraumatic.  Mouth/Throat: Oropharynx is clear and moist.  Right TM obscured by cerumen, irrigated by CMA revealing a normal TM and resolution of the patient's symptoms, left TM normal  Eyes: Conjunctivae are normal.  Chronic changes to right pupil  Cardiovascular: Normal rate, regular rhythm and normal heart sounds.  Pulmonary/Chest: Effort normal and breath sounds normal.  Abdominal: Soft. Bowel sounds are normal. She exhibits no distension. There is no tenderness. There is no rebound and no guarding.  Musculoskeletal: She exhibits no edema.  Neurological: She is alert.  Skin: Skin is warm and dry. She is not diaphoretic.  Psychiatric: She has a normal mood and affect.  Left mid dorsal forearm with small area of thickened skin   Assessment/Plan:   Encounter for general adult medical examination with abnormal findings Physical exam completed.  Encouraged adding exercise and working on her diet.  Will refer to gynecology for breast and pelvic exams.  She notes she will get her mammogram ordered through gynecology.  Encouraged her to see ophthalmology for follow-up.  Referral placed.  Lab work as outlined below.  Flu vaccination given.  Skin lesion Refer to dermatology.  Cerumen impaction Resolved after irrigation.  Tolerated this well.   Orders Placed  This Encounter  Procedures  . Flu Vaccine QUAD 6+ mos PF IM (Fluarix Quad PF)  . TSH  . Comp Met (CMET)  . Lipid panel  . HgB A1c  . CBC  . Ambulatory referral to Gynecology    Referral Priority:   Routine    Referral Type:   Consultation    Referral Reason:   Specialty Services Required    Requested Specialty:   Gynecology    Number of Visits Requested:   1  . Ambulatory referral to Ophthalmology    Referral Priority:   Routine    Referral Type:   Consultation    Referral Reason:   Specialty Services Required    Requested Specialty:   Ophthalmology    Number of Visits Requested:   1  .  Ambulatory referral to Dermatology    Referral Priority:   Routine    Referral Type:   Consultation    Referral Reason:   Specialty Services Required    Requested Specialty:   Dermatology    Number of Visits Requested:   1    No orders of the defined types were placed in this encounter.    Nancy Rumps, MD [facility]

## 2018-06-20 NOTE — Patient Instructions (Signed)
Nice to see you. We will refer you to to gynecology, ophthalmology, and dermatology. Please monitor your right ear and if it does not stay improved please let us know. We will check lab work today.

## 2018-06-21 DIAGNOSIS — L989 Disorder of the skin and subcutaneous tissue, unspecified: Secondary | ICD-10-CM | POA: Insufficient documentation

## 2018-06-21 DIAGNOSIS — H6120 Impacted cerumen, unspecified ear: Secondary | ICD-10-CM | POA: Insufficient documentation

## 2018-06-21 NOTE — Assessment & Plan Note (Signed)
Resolved after irrigation.  Tolerated this well.

## 2018-06-21 NOTE — Assessment & Plan Note (Signed)
Physical exam completed.  Encouraged adding exercise and working on her diet.  Will refer to gynecology for breast and pelvic exams.  She notes she will get her mammogram ordered through gynecology.  Encouraged her to see ophthalmology for follow-up.  Referral placed.  Lab work as outlined below.  Flu vaccination given.

## 2018-06-21 NOTE — Assessment & Plan Note (Signed)
Refer to dermatology 

## 2018-07-10 ENCOUNTER — Other Ambulatory Visit (HOSPITAL_COMMUNITY)
Admission: RE | Admit: 2018-07-10 | Discharge: 2018-07-10 | Disposition: A | Discharge status: Home / Self Care | Payer: 59 | Source: Ambulatory Visit | Attending: Obstetrics and Gynecology | Admitting: Obstetrics and Gynecology

## 2018-07-10 ENCOUNTER — Encounter: Payer: Self-pay | Admitting: Obstetrics and Gynecology

## 2018-07-10 ENCOUNTER — Ambulatory Visit (INDEPENDENT_AMBULATORY_CARE_PROVIDER_SITE_OTHER): Payer: 59 | Admitting: Obstetrics and Gynecology

## 2018-07-10 VITALS — BP 126/84 | Ht 62.0 in | Wt 224.0 lb

## 2018-07-10 DIAGNOSIS — Z124 Encounter for screening for malignant neoplasm of cervix: Secondary | ICD-10-CM | POA: Diagnosis not present

## 2018-07-10 DIAGNOSIS — N643 Galactorrhea not associated with childbirth: Secondary | ICD-10-CM

## 2018-07-10 DIAGNOSIS — Z01419 Encounter for gynecological examination (general) (routine) without abnormal findings: Secondary | ICD-10-CM | POA: Insufficient documentation

## 2018-07-10 DIAGNOSIS — Z1339 Encounter for screening examination for other mental health and behavioral disorders: Secondary | ICD-10-CM

## 2018-07-10 DIAGNOSIS — Z1239 Encounter for other screening for malignant neoplasm of breast: Secondary | ICD-10-CM

## 2018-07-10 DIAGNOSIS — Z1331 Encounter for screening for depression: Secondary | ICD-10-CM

## 2018-07-10 NOTE — Progress Notes (Signed)
sGynecology Annual Exam  PCP: Glori LuisSonnenberg, Eric G, MD  Chief Complaint  Patient presents with  . Annual Exam   History of Present Illness:  Ms. Nancy Donovan is a 47 y.o. [id number] who LMP was Patient's last menstrual period was 06/26/2018., presents today for her annual examination.  Her menses are regular every 28-30 days, lasting 3 day(s).  Dysmenorrhea none. She does not have intermenstrual bleeding.  She does notice breast symptoms around the time of her period and she occasionally has leakage of fluid from her nipples.  She does not have vasomotor symptoms.   She is single partner, contraception - tubal ligation.   Last Pap: 2015  Results were: no abnormalities /neg HPV DNA negative.  Hx of STDs: none  Last mammogram: 2017  Results were: normal--routine follow-up in 12 months There is no FH of breast cancer. There is no FH of ovarian cancer. The patient does do self-breast exams.  Colonoscopy: n/a DEXA: has not been screened for osteoporosis  Tobacco use: fomer. Two years of use in her 2020s. Alcohol use: social drinker Exercise: not active  The patient wears seatbelts: yes.     Past Medical History:  Diagnosis Date  . Chicken pox   . Obesity (BMI 30-39.9)   . Vertigo     Past Surgical History:  Procedure Laterality Date  . CESAREAN SECTION  2008  . CESAREAN SECTION  2011   repeat c-seaction  . CHOLECYSTECTOMY  1998  . DILATION AND CURETTAGE OF UTERUS  2010  . DILATION AND CURETTAGE OF UTERUS  2011  . EYE SURGERY  1978    Prior to Admission medications   Medication Sig Start Date End Date Taking? Authorizing Provider  fexofenadine (ALLEGRA) 30 MG tablet Take 30 mg by mouth daily.    Yes [provider]  meclizine (ANTIVERT) 25 MG tablet Take 25 mg by mouth 3 (three) times daily as needed for dizziness (takes 1/2 tab at a time).   Yes [provider]   Allergies: No Known Allergies  Obstetric History: [id number]: G4P2022, h/o c-section x 2  Family History   Problem Relation Age of Onset  . Hypertension Mother   . Diabetes Mother   . Hypertension Father     Social History   Socioeconomic History  . Marital status: Married    Spouse name: Not on file  . Number of children: Not on file  . Years of education: Not on file  . Highest education level: Not on file  Occupational History  . Not on file  Social Needs  . Financial resource strain: Not on file  . Food insecurity:    Worry: Not on file    Inability: Not on file  . Transportation needs:    Medical: Not on file    Non-medical: Not on file  Tobacco Use  . Smoking status: Former Games developermoker  . Smokeless tobacco: Never Used  Substance and Sexual Activity  . Alcohol use: Yes    Alcohol/week: 0.0 - 1.0 standard drinks  . Drug use: No  . Sexual activity: Yes    Partners: Male    Birth control/protection: Surgical  Lifestyle  . Physical activity:    Days per week: Not on file    Minutes per session: Not on file  . Stress: Not on file  Relationships  . Social connections:    Talks on phone: Not on file    Gets together: Not on file    Attends religious service: Not on file  Active member of club or organization: Not on file    Attends meetings of clubs or organizations: Not on file    Relationship status: Not on file  . Intimate partner violence:    Fear of current or ex partner: Not on file    Emotionally abused: Not on file    Physically abused: Not on file    Forced sexual activity: Not on file  Other Topics Concern  . Not on file  Social History Narrative  . Not on file    Review of Systems  Constitutional: Positive for malaise/fatigue.  HENT: Negative.   Eyes: Negative.   Respiratory: Negative.   Cardiovascular: Negative.   Gastrointestinal: Negative.   Genitourinary: Negative.   Musculoskeletal: Negative.   Skin: Negative.        Breast tenderness, nipple discharge (not bloody)  Neurological: Negative.   Psychiatric/Behavioral: Negative.       Physical Exam BP 126/84   Ht 5\' 2"  (1.575 m)   Wt 224 lb (101.6 kg)   LMP 06/26/2018   BMI 40.97 kg/m   Physical Exam  Constitutional: She is oriented to person, place, and time. She appears well-developed and well-nourished. No distress.  Genitourinary: Uterus normal. Pelvic exam was performed with patient supine. There is no rash, tenderness, lesion or injury on the right labia. There is no rash, tenderness, lesion or injury on the left labia. No erythema, tenderness or bleeding in the vagina. No signs of injury around the vagina. No vaginal discharge found. Right adnexum does not display mass, does not display tenderness and does not display fullness. Left adnexum does not display mass, does not display tenderness and does not display fullness. Cervix does not exhibit motion tenderness, lesion, discharge or polyp.   Uterus is mobile and anteverted. Uterus is not enlarged, tender or exhibiting a mass.  HENT:  Head: Normocephalic and atraumatic.  Eyes: EOM are normal. No scleral icterus.  Neck: Normal range of motion. Neck supple. No thyromegaly present.  Cardiovascular: Normal rate and regular rhythm. Exam reveals no gallop and no friction rub.  No murmur heard. Pulmonary/Chest: Effort normal and breath sounds normal. No respiratory distress. She has no wheezes. She has no rales. Right breast exhibits no inverted nipple, no mass, no nipple discharge, no skin change and no tenderness. Left breast exhibits inverted nipple (she states this has been present her whole life). Left breast exhibits no mass, no nipple discharge, no skin change and no tenderness.  Abdominal: Soft. Bowel sounds are normal. She exhibits no distension and no mass. There is no tenderness. There is no rebound and no guarding.  Musculoskeletal: Normal range of motion. She exhibits no edema or tenderness.  Lymphadenopathy:    She has no cervical adenopathy.       Right: No inguinal adenopathy present.       Left: No  inguinal adenopathy present.  Neurological: She is alert and oriented to person, place, and time. No cranial nerve deficit.  Skin: Skin is warm and dry. No rash noted. No erythema.  Psychiatric: She has a normal mood and affect. Her behavior is normal. Judgment normal.    Female chaperone present for pelvic and breast  portions of the physical exam  Results: AUDIT Questionnaire (screen for alcoholism): 1 PHQ-9: 1  Assessment: 47 y.o. [id number] female here for routine gynecologic examination.  Plan: Problem List Items Addressed This Visit    None    Visit Diagnoses    Women's annual routine gynecological examination    -  Primary   Relevant Orders   Prolactin   MM DIGITAL SCREENING BILATERAL   Cytology - PAP   Screening for depression       Screening for alcoholism       Pap smear for cervical cancer screening       Relevant Orders   Cytology - PAP   Galactorrhea       Relevant Orders   Prolactin   Screening for breast cancer       Relevant Orders   MM DIGITAL SCREENING BILATERAL      Screening: -- Blood pressure screen normal -- Colonoscopy - not due -- Mammogram - due. Patient to call [facility] to arrange. She understands that it is her responsibility to arrange this. -- Weight screening: obese: discussed management options, including lifestyle, dietary, and exercise. -- Depression screening negative (PHQ-9) -- Nutrition: normal -- cholesterol screening: per PCP -- osteoporosis screening: not due -- tobacco screening: not using -- alcohol screening: AUDIT questionnaire indicates low-risk usage. -- family history of breast cancer screening: done. not at high risk. -- no evidence of domestic violence or intimate partner violence. -- STD screening: gonorrhea/chlamydia NAAT not collected per patient request. -- pap smear collected per ASCCP guidelines -- HPV vaccination series: not eligilbe   Galactorrhea: will obtain prolactin and have her get a mammogram. If both  normal, will continue to monitor as this can be physiologic.   Of note: this is an established patient. Her prior records are in our prior EMR.    Thomasene Mohair, MD 07/10/2018 10:30 AM

## 2018-07-11 LAB — PROLACTIN: PROLACTIN: 7.2 ng/mL (ref 4.8–23.3)

## 2018-07-12 ENCOUNTER — Encounter: Payer: Self-pay | Admitting: Obstetrics and Gynecology

## 2018-07-12 LAB — CYTOLOGY - PAP
DIAGNOSIS: NEGATIVE
HPV: NOT DETECTED

## 2018-08-22 ENCOUNTER — Other Ambulatory Visit: Payer: Self-pay | Admitting: Obstetrics and Gynecology

## 2018-08-22 DIAGNOSIS — Z01419 Encounter for gynecological examination (general) (routine) without abnormal findings: Secondary | ICD-10-CM

## 2018-08-22 DIAGNOSIS — Z1239 Encounter for other screening for malignant neoplasm of breast: Secondary | ICD-10-CM

## 2018-08-23 ENCOUNTER — Ambulatory Visit
Admission: RE | Admit: 2018-08-23 | Discharge: 2018-08-23 | Disposition: A | Discharge status: Home / Self Care | Payer: 59 | Source: Ambulatory Visit | Attending: Obstetrics and Gynecology | Admitting: Obstetrics and Gynecology

## 2018-08-23 DIAGNOSIS — Z01419 Encounter for gynecological examination (general) (routine) without abnormal findings: Secondary | ICD-10-CM | POA: Diagnosis not present

## 2018-08-23 DIAGNOSIS — Z1239 Encounter for other screening for malignant neoplasm of breast: Secondary | ICD-10-CM | POA: Insufficient documentation

## 2018-08-23 DIAGNOSIS — Z1231 Encounter for screening mammogram for malignant neoplasm of breast: Secondary | ICD-10-CM | POA: Diagnosis not present

## 2018-08-28 DIAGNOSIS — L820 Inflamed seborrheic keratosis: Secondary | ICD-10-CM | POA: Diagnosis not present

## 2018-08-28 DIAGNOSIS — L821 Other seborrheic keratosis: Secondary | ICD-10-CM | POA: Diagnosis not present

## 2020-04-25 ENCOUNTER — Other Ambulatory Visit: Payer: Self-pay

## 2020-04-25 ENCOUNTER — Encounter: Payer: Self-pay | Admitting: Obstetrics and Gynecology

## 2020-04-25 ENCOUNTER — Ambulatory Visit (INDEPENDENT_AMBULATORY_CARE_PROVIDER_SITE_OTHER): Payer: 59 | Admitting: Obstetrics and Gynecology

## 2020-04-25 VITALS — BP 140/80 | Ht 63.0 in | Wt 229.0 lb

## 2020-04-25 DIAGNOSIS — Z01419 Encounter for gynecological examination (general) (routine) without abnormal findings: Secondary | ICD-10-CM

## 2020-04-25 DIAGNOSIS — Z1339 Encounter for screening examination for other mental health and behavioral disorders: Secondary | ICD-10-CM

## 2020-04-25 DIAGNOSIS — Z1331 Encounter for screening for depression: Secondary | ICD-10-CM | POA: Diagnosis not present

## 2020-04-25 NOTE — Progress Notes (Signed)
Gynecology Annual Exam  PCP: Conard Novak, MD  Chief Complaint  Patient presents with  . Gynecologic Exam    no concerns   History of Present Illness:  Ms. Nancy Donovan is a 49 y.o. [id number] who LMP was Patient's last menstrual period was 04/09/2020 (approximate)., presents today for her annual examination.  Her menses are regular every 28-30 days, lasting 4 day(s).  Dysmenorrhea none. She does not have intermenstrual bleeding.  She may have some hot flashes.  She gets hot and it takes her 30 minutes to get back to normal temperature. She states that she has a history of not sweating.    She is sexually active (s/p BTL). She does not have vaginal dryness.  Last Pap: 07/10/2018  Results were: no abnormalities /neg HPV DNA.  Hx of STDs: none  Last mammogram: 08/2018  Results were: normal--routine follow-up in 12 months There is no FH of breast cancer. There is no FH of ovarian cancer. The patient does do self-breast exams.  Colonoscopy: never had. DEXA: has not been screened for osteoporosis  Tobacco use: The patient denies current or previous tobacco use. Alcohol use: none Exercise: no  The patient wears seatbelts: yes.     She has had some mood swing, feeling hot at night, lack of motivation.    Past Medical History:  Diagnosis Date  . Chicken pox   . Obesity (BMI 30-39.9)   . Vertigo     Past Surgical History:  Procedure Laterality Date  . CESAREAN SECTION  2008  . CESAREAN SECTION  2011   repeat c-seaction  . CHOLECYSTECTOMY  1998  . DILATION AND CURETTAGE OF UTERUS  2010  . DILATION AND CURETTAGE OF UTERUS  2011  . EYE SURGERY  1978    Prior to Admission medications   Medication Sig Start Date End Date Taking? Authorizing Provider  fexofenadine (ALLEGRA) 30 MG tablet Take 30 mg by mouth daily.    Yes [provider]  meclizine (ANTIVERT) 25 MG tablet Take 25 mg by mouth 3 (three) times daily as needed for dizziness (takes 1/2 tab at a  time). Patient not taking: Reported on 04/25/2020    [provider]   Allergies: No Known Allergies  Obstetric History: [id number]  Family History  Problem Relation Age of Onset  . Hypertension Mother   . Diabetes Mother   . Hypertension Father     Social History   Socioeconomic History  . Marital status: Married    Spouse name: Not on file  . Number of children: Not on file  . Years of education: Not on file  . Highest education level: Not on file  Occupational History  . Not on file  Tobacco Use  . Smoking status: Former Games developer  . Smokeless tobacco: Never Used  Vaping Use  . Vaping Use: Never used  Substance and Sexual Activity  . Alcohol use: Yes    Alcohol/week: 0.0 - 1.0 standard drinks  . Drug use: No  . Sexual activity: Yes    Partners: Male    Birth control/protection: Surgical    Comment: Tubal Ligation  Other Topics Concern  . Not on file  Social History Narrative  . Not on file   Social Determinants of Health   Financial Resource Strain:   . Difficulty of Paying Living Expenses: Not on file  Food Insecurity:   . Worried About Programme researcher, broadcasting/film/video in the Last Year: Not on file  . Ran Out of Food  in the Last Year: Not on file  Transportation Needs:   . Lack of Transportation (Medical): Not on file  . Lack of Transportation (Non-Medical): Not on file  Physical Activity:   . Days of Exercise per Week: Not on file  . Minutes of Exercise per Session: Not on file  Stress:   . Feeling of Stress : Not on file  Social Connections:   . Frequency of Communication with Friends and Family: Not on file  . Frequency of Social Gatherings with Friends and Family: Not on file  . Attends Religious Services: Not on file  . Active Member of Clubs or Organizations: Not on file  . Attends Banker Meetings: Not on file  . Marital Status: Not on file  Intimate Partner Violence:   . Fear of Current or Ex-Partner: Not on file  . Emotionally Abused:  Not on file  . Physically Abused: Not on file  . Sexually Abused: Not on file    Review of Systems  Constitutional: Negative.   HENT: Negative.   Eyes: Negative.   Respiratory: Negative.   Cardiovascular: Negative.   Gastrointestinal: Negative.   Genitourinary: Negative.   Musculoskeletal: Negative.   Skin: Negative.   Neurological: Negative.   Psychiatric/Behavioral: Negative.      Physical Exam BP 140/80   Ht 5\' 3"  (1.6 m)   Wt 229 lb (103.9 kg)   LMP 04/09/2020 (Approximate)   BMI 40.57 kg/m   Physical Exam Constitutional:      General: She is not in acute distress.    Appearance: Normal appearance. She is well-developed.  Genitourinary:     Pelvic exam was performed with patient in the lithotomy position.     Vulva, urethra, bladder and uterus normal.     No inguinal adenopathy present in the right or left side.    No signs of injury in the vagina.     No vaginal discharge, erythema, tenderness or bleeding.     No cervical motion tenderness, discharge, lesion or polyp.     Uterus is mobile.     Uterus is not enlarged or tender.     No uterine mass detected.    Uterus is anteverted.     No right or left adnexal mass present.     Right adnexa not tender or full.     Left adnexa not tender or full.  HENT:     Head: Normocephalic and atraumatic.  Eyes:     General: No scleral icterus.    Conjunctiva/sclera: Conjunctivae normal.  Neck:     Thyroid: No thyromegaly.  Cardiovascular:     Rate and Rhythm: Normal rate and regular rhythm.     Heart sounds: No murmur heard.  No friction rub. No gallop.   Pulmonary:     Effort: Pulmonary effort is normal. No respiratory distress.     Breath sounds: Normal breath sounds. No wheezing or rales.  Chest:     Breasts:        Right: No inverted nipple, mass, nipple discharge, skin change or tenderness.        Left: No inverted nipple, mass, nipple discharge, skin change or tenderness.  Abdominal:     General: Bowel  sounds are normal. There is no distension.     Palpations: Abdomen is soft. There is no mass.     Tenderness: There is no abdominal tenderness. There is no guarding or rebound.  Musculoskeletal:        General: No  swelling or tenderness. Normal range of motion.     Cervical back: Normal range of motion and neck supple.  Lymphadenopathy:     Cervical: No cervical adenopathy.     Lower Body: No right inguinal adenopathy. No left inguinal adenopathy.  Neurological:     General: No focal deficit present.     Mental Status: She is alert and oriented to person, place, and time.     Cranial Nerves: No cranial nerve deficit.  Skin:    General: Skin is warm and dry.     Findings: No erythema or rash.  Psychiatric:        Mood and Affect: Mood normal.        Behavior: Behavior normal.        Judgment: Judgment normal.     Female chaperone present for pelvic and breast  portions of the physical exam  Results: AUDIT Questionnaire (screen for alcoholism): 0 PHQ-9: 4  Assessment: 49 y.o. [id number] female here for routine gynecologic examination.  Plan: Problem List Items Addressed This Visit    None    Visit Diagnoses    Women's annual routine gynecological examination    -  Primary   Screening for depression       Screening for alcoholism          Screening: -- Blood pressure screen elevated: continued to monitor. -- Colonoscopy - due - managed by PCP -- Mammogram - due. Patient to call [facility] to arrange. She understands that it is her responsibility to arrange this. -- Weight screening: overweight: continue to monitor -- Depression screening negative (PHQ-9) -- Nutrition: normal -- cholesterol screening: per PCP -- osteoporosis screening: not due -- tobacco screening: not using -- alcohol screening: AUDIT questionnaire indicates low-risk usage. -- family history of breast cancer screening: done. not at high risk. -- no evidence of domestic violence or intimate partner  violence. -- STD screening: gonorrhea/chlamydia NAAT not collected per patient request. -- pap smear not collected per ASCCP guidelines  -- She has not received COVID19 vaccination. She had COVID at one point per her.   Thomasene Mohair, MD 04/25/2020 11:51 AM

## 2020-05-09 ENCOUNTER — Other Ambulatory Visit: Payer: Self-pay

## 2020-05-09 ENCOUNTER — Encounter: Payer: Self-pay | Admitting: Family Medicine

## 2020-05-09 ENCOUNTER — Telehealth: Payer: Self-pay | Admitting: Family Medicine

## 2020-05-09 ENCOUNTER — Ambulatory Visit (INDEPENDENT_AMBULATORY_CARE_PROVIDER_SITE_OTHER): Payer: 59 | Admitting: Family Medicine

## 2020-05-09 VITALS — BP 130/80 | HR 82 | Temp 97.4°F | Ht 63.0 in | Wt 226.4 lb

## 2020-05-09 DIAGNOSIS — R5382 Chronic fatigue, unspecified: Secondary | ICD-10-CM | POA: Diagnosis not present

## 2020-05-09 DIAGNOSIS — E669 Obesity, unspecified: Secondary | ICD-10-CM | POA: Diagnosis not present

## 2020-05-09 DIAGNOSIS — F418 Other specified anxiety disorders: Secondary | ICD-10-CM

## 2020-05-09 DIAGNOSIS — R5383 Other fatigue: Secondary | ICD-10-CM | POA: Insufficient documentation

## 2020-05-09 DIAGNOSIS — R229 Localized swelling, mass and lump, unspecified: Secondary | ICD-10-CM | POA: Diagnosis not present

## 2020-05-09 DIAGNOSIS — R030 Elevated blood-pressure reading, without diagnosis of hypertension: Secondary | ICD-10-CM | POA: Diagnosis not present

## 2020-05-09 DIAGNOSIS — Z0001 Encounter for general adult medical examination with abnormal findings: Secondary | ICD-10-CM

## 2020-05-09 DIAGNOSIS — Z1211 Encounter for screening for malignant neoplasm of colon: Secondary | ICD-10-CM | POA: Insufficient documentation

## 2020-05-09 DIAGNOSIS — F32A Depression, unspecified: Secondary | ICD-10-CM | POA: Insufficient documentation

## 2020-05-09 DIAGNOSIS — F419 Anxiety disorder, unspecified: Secondary | ICD-10-CM

## 2020-05-09 LAB — TSH: TSH: 2.22 u[IU]/mL (ref 0.35–4.50)

## 2020-05-09 LAB — CBC
HCT: 40 % (ref 36.0–46.0)
Hemoglobin: 13.6 g/dL (ref 12.0–15.0)
MCHC: 34.1 g/dL (ref 30.0–36.0)
MCV: 86.3 fl (ref 78.0–100.0)
Platelets: 291 10*3/uL (ref 150.0–400.0)
RBC: 4.63 Mil/uL (ref 3.87–5.11)
RDW: 13.5 % (ref 11.5–15.5)
WBC: 7.3 10*3/uL (ref 4.0–10.5)

## 2020-05-09 LAB — IBC + FERRITIN
Ferritin: 66.4 ng/mL (ref 10.0–291.0)
Iron: 71 ug/dL (ref 42–145)
Saturation Ratios: 21.6 % (ref 20.0–50.0)
Transferrin: 235 mg/dL (ref 212.0–360.0)

## 2020-05-09 LAB — COMPREHENSIVE METABOLIC PANEL
ALT: 18 U/L (ref 0–35)
AST: 13 U/L (ref 0–37)
Albumin: 4.1 g/dL (ref 3.5–5.2)
Alkaline Phosphatase: 89 U/L (ref 39–117)
BUN: 12 mg/dL (ref 6–23)
CO2: 29 mEq/L (ref 19–32)
Calcium: 9.3 mg/dL (ref 8.4–10.5)
Chloride: 99 mEq/L (ref 96–112)
Creatinine, Ser: 0.93 mg/dL (ref 0.40–1.20)
GFR: 64.52 mL/min (ref >60.00)
Glucose, Bld: 97 mg/dL (ref 70–99)
Potassium: 4.1 mEq/L (ref 3.5–5.1)
Sodium: 137 mEq/L (ref 135–145)
Total Bilirubin: 0.7 mg/dL (ref 0.2–1.2)
Total Protein: 7.3 g/dL (ref 6.0–8.3)

## 2020-05-09 LAB — VITAMIN D 25 HYDROXY (VIT D DEFICIENCY, FRACTURES): VITD: 24.83 ng/mL — ABNORMAL LOW (ref 30.00–100.00)

## 2020-05-09 LAB — LIPID PANEL
Cholesterol: 148 mg/dL (ref 0–200)
HDL: 42 mg/dL (ref >39.00)
LDL Cholesterol: 77 mg/dL (ref 0–99)
NonHDL: 105.62
Total CHOL/HDL Ratio: 4
Triglycerides: 142 mg/dL (ref 0.0–149.0)
VLDL: 28.4 mg/dL (ref 0.0–40.0)

## 2020-05-09 LAB — HEMOGLOBIN A1C: Hgb A1c MFr Bld: 5.5 % (ref 4.6–6.5)

## 2020-05-09 LAB — VITAMIN B12: Vitamin B-12: 326 pg/mL (ref 211–911)

## 2020-05-09 LAB — T4, FREE: Free T4: 0.86 ng/dL (ref 0.60–1.60)

## 2020-05-09 NOTE — Assessment & Plan Note (Signed)
Physical exam completed.  Encouraged healthy diet and exercise.  Breast and pelvic exam deferred as she follows with gynecology and has had those recently.  I encouraged her to call for her mammogram.  We will refer for colonoscopy.  She declines tetanus vaccine, Covid vaccine, and flu vaccine.  I encouraged her to get all of these when she is ready to get them.  I did discuss the safety of the Covid vaccine and the benefit of the Covid vaccine.  She declines hepatitis C screening.  Lab work as outlined below.

## 2020-05-09 NOTE — Assessment & Plan Note (Addendum)
Undetermined cause.  Could represent lipomas versus lymph nodes though the locations would be a little odd for lymphadenopathy.    Neck step in management will be determined by ultrasound result.

## 2020-05-09 NOTE — Assessment & Plan Note (Signed)
I suspect this is related to the depression though will obtain lab work as outlined below to evaluate for other possible causes.

## 2020-05-09 NOTE — Progress Notes (Signed)
Tommi Rumps, MD Phone: [phone]  Nancy Donovan is a 49 y.o. female who presents today for CPE.  Diet: Patient describes diet is horrible.  She is not cooking as much.  She has gained some weight. Exercise: None Pap smear: 07/10/2018 NILM negative for HPV Colonoscopy: Due Mammogram: Due Family history-  Colon cancer: No  Breast cancer: No  Ovarian cancer: No Menses: Monthly lasting 3 to 4 days Vaccines-   Flu: Defers  Tetanus: Defers  COVID19: Defers HIV screening: Up-to-date Hep C Screening: Defers Tobacco use: No Alcohol use: No Illicit Drug use: No Dentist: Yes Ophthalmology: Yes  Elevated blood pressure: Patient notes its been elevated on a couple of occasions recently.  She has not been checking it at home.  She does have a family history in both of her parents and one of her siblings.  She reports some headaches which she feels are related to the elevated blood pressure.  Subcutaneous nodules: These have been present for 6 to 9 months.  They are in her bilateral forearms.  She has noted 1 near her left elbow most prominently though also along the radial aspect of her bilateral forearms.  No pain with these.  Anxiety/depression: Patient feels as though she has had some anxiety and depression.  She notes it is hard to tell if it is one or the other.  She has had a decreased desire to do things that she enjoys.  She also reports decreased appetite and energy level.  Notes some hot flashes at night.  She notes her depressive and anxiety symptoms seem to have been improving recently as she has been able to get out and do more.  No SI.   Active Ambulatory Problems    Diagnosis Date Noted  . Obesity (BMI 30-39.9)   . Encounter for general adult medical examination with abnormal findings 02/15/2017  . Skin lesion 06/21/2018  . Cerumen impaction 06/21/2018  . Anxiety and depression 05/09/2020  . Subcutaneous nodule 05/09/2020  . Fatigue 05/09/2020  . Colon cancer  screening 05/09/2020  . Elevated BP without diagnosis of hypertension 05/09/2020   Resolved Ambulatory Problems    Diagnosis Date Noted  . Encounter for preventative adult health care exam with abnormal findings 12/26/2015  . Ankle pain 12/26/2015  . Rash 12/26/2015   Past Medical History:  Diagnosis Date  . Chicken pox   . Vertigo     Family History  Problem Relation Age of Onset  . Hypertension Mother   . Diabetes Mother   . Hypertension Father     Social History   Socioeconomic History  . Marital status: Married    Spouse name: Not on file  . Number of children: Not on file  . Years of education: Not on file  . Highest education level: Not on file  Occupational History  . Not on file  Tobacco Use  . Smoking status: Former Research scientist (life sciences)  . Smokeless tobacco: Never Used  Vaping Use  . Vaping Use: Never used  Substance and Sexual Activity  . Alcohol use: Yes    Alcohol/week: 0.0 - 1.0 standard drinks  . Drug use: No  . Sexual activity: Yes    Partners: Male    Birth control/protection: Surgical    Comment: Tubal Ligation  Other Topics Concern  . Not on file  Social History Narrative  . Not on file   Social Determinants of Health   Financial Resource Strain:   . Difficulty of Paying Living Expenses: Not on file  Food Insecurity:   . Worried About Charity fundraiser in the Last Year: Not on file  . Ran Out of Food in the Last Year: Not on file  Transportation Needs:   . Lack of Transportation (Medical): Not on file  . Lack of Transportation (Non-Medical): Not on file  Physical Activity:   . Days of Exercise per Week: Not on file  . Minutes of Exercise per Session: Not on file  Stress:   . Feeling of Stress : Not on file  Social Connections:   . Frequency of Communication with Friends and Family: Not on file  . Frequency of Social Gatherings with Friends and Family: Not on file  . Attends Religious Services: Not on file  . Active Member of Clubs or  Organizations: Not on file  . Attends Archivist Meetings: Not on file  . Marital Status: Not on file  Intimate Partner Violence:   . Fear of Current or Ex-Partner: Not on file  . Emotionally Abused: Not on file  . Physically Abused: Not on file  . Sexually Abused: Not on file    ROS  General:  Negative for nexplained weight loss, fever Skin: Negative for new or changing mole, sore that won't heal HEENT: Positive for trouble seeing (evaluated by eye doctor), negative for trouble hearing, ringing in ears, mouth sores, hoarseness, change in voice, dysphagia. CV:  Negative for chest pain, dyspnea, edema, palpitations Resp: Negative for cough, dyspnea, hemoptysis GI: Negative for nausea, vomiting, diarrhea, constipation, abdominal pain, melena, hematochezia. GU: Negative for dysuria, incontinence, urinary hesitance, hematuria, vaginal or penile discharge, polyuria, sexual difficulty, lumps in testicle or breasts MSK: Negative for muscle cramps or aches, joint pain or swelling Neuro: Positive for headaches, negative for weakness, numbness, dizziness, passing out/fainting Psych: Positive for depression, anxiety, negative for memory problems  Objective  Physical Exam Vitals:   05/09/20 0936  BP: 130/80  Pulse: 82  Temp: (!) 97.4 F (36.3 C)  SpO2: 99%    BP Readings from Last 3 Encounters:  05/09/20 130/80  04/25/20 140/80  07/10/18 126/84   Wt Readings from Last 3 Encounters:  05/09/20 226 lb 6.4 oz (102.7 kg)  04/25/20 229 lb (103.9 kg)  07/10/18 224 lb (101.6 kg)    Physical Exam Constitutional:      General: She is not in acute distress.    Appearance: She is not diaphoretic.  HENT:     Head: Normocephalic and atraumatic.  Eyes:     Conjunctiva/sclera: Conjunctivae normal.     Comments: Left pupil round and reactive to light,, right pupil with chronic changes from prior eye injury  Cardiovascular:     Rate and Rhythm: Normal rate and regular rhythm.      Heart sounds: Normal heart sounds.  Pulmonary:     Effort: Pulmonary effort is normal.     Breath sounds: Normal breath sounds.  Abdominal:     General: Bowel sounds are normal. There is no distension.     Palpations: Abdomen is soft.     Tenderness: There is no abdominal tenderness. There is no guarding or rebound.  Musculoskeletal:     Right lower leg: No edema.     Left lower leg: No edema.  Lymphadenopathy:     Cervical: No cervical adenopathy.  Skin:    General: Skin is warm and dry.     Comments: Subcutaneous nodules noted in forearms along the ulnar aspect with most prominent one near her left elbow, these  are occurring in bilateral forearms, no tenderness, no overlying skin changes  Neurological:     Mental Status: She is alert.  Psychiatric:     Comments: Mood depressed      Assessment/Plan:   Encounter for general adult medical examination with abnormal findings Physical exam completed.  Encouraged healthy diet and exercise.  Breast and pelvic exam deferred as she follows with gynecology and has had those recently.  I encouraged her to call for her mammogram.  We will refer for colonoscopy.  She declines tetanus vaccine, Covid vaccine, and flu vaccine.  I encouraged her to get all of these when she is ready to get them.  I did discuss the safety of the Covid vaccine and the benefit of the Covid vaccine.  She declines hepatitis C screening.  Lab work as outlined below.  Anxiety and depression Appears to be mostly mild depression at this time.  She seems to have been improving on her own.  We will hold off on any medication treatment and she will continue to monitor.  She will add in some exercise to see if that is beneficial.  Colon cancer screening Refer for colonoscopy.  Subcutaneous nodule Undetermined cause.  Could represent lipomas versus lymph nodes though the locations would be a little odd for lymphadenopathy.    Neck step in management will be determined by  ultrasound result.  Fatigue I suspect this is related to the depression though will obtain lab work as outlined below to evaluate for other possible causes.  Elevated BP without diagnosis of hypertension She will start checking daily at home and have a nurse BP check in 2 weeks.  She will bring her readings to that.  If blood pressure is elevated consistently at home would consider starting her on medication.  Follow-up in 3 months.   Orders Placed This Encounter  Procedures  . [place] LT UPPER EXTREM LTD SOFT TISSUE NON VASCULAR    Standing Status:   Future    Standing Expiration Date:   05/09/2021    Order Specific Question:   Reason for Exam (SYMPTOM  OR DIAGNOSIS REQUIRED)    Answer:   subcutaneous soft tissue lesion near left elbow, also similar lesions in forearm    Order Specific Question:   Preferred imaging location?    Answer:   [facility]  . Comp Met (CMET)  . Lipid panel  . HgB A1c  . TSH  . T4, free  . CBC  . IBC + Ferritin  . Vitamin D (25 hydroxy)  . B12  . Ambulatory referral to Gastroenterology    Referral Priority:   Routine    Referral Type:   Consultation    Referral Reason:   Specialty Services Required    Number of Visits Requested:   1    No orders of the defined types were placed in this encounter.   This visit occurred during the SARS-CoV-2 public health emergency.  Safety protocols were in place, including screening questions prior to the visit, additional usage of staff PPE, and extensive cleaning of exam room while observing appropriate contact time as indicated for disinfecting solutions.    Tommi Rumps, MD [place]

## 2020-05-09 NOTE — Telephone Encounter (Signed)
lft vm for pt to call ofc to sch US 

## 2020-05-09 NOTE — Assessment & Plan Note (Signed)
She will start checking daily at home and have a nurse BP check in 2 weeks.  She will bring her readings to that.  If blood pressure is elevated consistently at home would consider starting her on medication.  Follow-up in 3 months.

## 2020-05-09 NOTE — Patient Instructions (Signed)
Nice to see you. We will get lab work today and contact you with the results. Please check your blood pressure daily for the next 2 weeks.  We will have you follow-up with nursing at that time.  Please bring your readings with you. We are getting an ultrasound of your left arm.  Somebody should contact you to schedule this. I have referred you to GI for colonoscopy.

## 2020-05-09 NOTE — Assessment & Plan Note (Signed)
Refer for colonoscopy 

## 2020-05-09 NOTE — Assessment & Plan Note (Signed)
Appears to be mostly mild depression at this time.  She seems to have been improving on her own.  We will hold off on any medication treatment and she will continue to monitor.  She will add in some exercise to see if that is beneficial.

## 2020-05-22 ENCOUNTER — Telehealth (INDEPENDENT_AMBULATORY_CARE_PROVIDER_SITE_OTHER): Payer: Self-pay | Admitting: Gastroenterology

## 2020-05-22 DIAGNOSIS — Z1211 Encounter for screening for malignant neoplasm of colon: Secondary | ICD-10-CM

## 2020-05-22 MED ORDER — PEG 3350-KCL-NA BICARB-NACL 420 G PO SOLR: 4000.0000 mL | Freq: Once | ORAL | 0 refills | Status: AC

## 2020-05-22 NOTE — Progress Notes (Signed)
Gastroenterology Pre-Procedure Review  Request Date: Friday 06/27/20 Requesting Physician: Dr. Maximino Greenland  PATIENT REVIEW QUESTIONS: The patient responded to the following health history questions as indicated:    1. Are you having any GI issues? no 2. Do you have a personal history of Polyps? no 3. Do you have a family history of Colon Cancer or Polyps? no 4. Diabetes Mellitus? no 5. Joint replacements in the past 12 months?no 6. Major health problems in the past 3 months?no 7. Any artificial heart valves, MVP, or defibrillator?no    MEDICATIONS & ALLERGIES:    Patient reports the following regarding taking any anticoagulation/antiplatelet therapy:   Plavix, Coumadin, Eliquis, Xarelto, Lovenox, Pradaxa, Brilinta, or Effient? no Aspirin? no  Patient confirms/reports the following medications:  Current Outpatient Medications  Medication Sig Dispense Refill  . fexofenadine (ALLEGRA) 30 MG tablet Take 30 mg by mouth daily.     . meclizine (ANTIVERT) 25 MG tablet Take 25 mg by mouth 3 (three) times daily as needed for dizziness (takes 1/2 tab at a time).      No current facility-administered medications for this visit.    Patient confirms/reports the following allergies:  No Known Allergies  No orders of the defined types were placed in this encounter.   AUTHORIZATION INFORMATION Primary Insurance: 1D#: Group #:  Secondary Insurance: 1D#: Group #:  SCHEDULE INFORMATION: Date: 06/27/20 Time: Location:[facility]

## 2020-05-23 ENCOUNTER — Ambulatory Visit: Payer: 59

## 2020-05-27 ENCOUNTER — Ambulatory Visit: Payer: 59

## 2020-05-28 ENCOUNTER — Ambulatory Visit: Payer: 59

## 2020-06-13 ENCOUNTER — Ambulatory Visit: Payer: 59

## 2020-06-16 ENCOUNTER — Telehealth: Payer: Self-pay

## 2020-06-16 NOTE — Telephone Encounter (Signed)
Returned patients call. Patient would like to cancel appointment at this time. Had to cancel due to personal reasons. Will cb in April to reschedule.

## 2020-06-27 ENCOUNTER — Ambulatory Visit: Admit: 2020-06-27 | Payer: 59 | Admitting: Gastroenterology

## 2020-06-27 SURGERY — COLONOSCOPY WITH PROPOFOL
Anesthesia: General

## 2020-08-15 ENCOUNTER — Ambulatory Visit: Payer: 59 | Admitting: Family Medicine

## 2020-08-23 IMAGING — MG DIGITAL SCREENING BILATERAL MAMMOGRAM WITH TOMO AND CAD
8 series · 8 of 24 positions shown · non-contrast
Comparison: Previous exam(s).

CLINICAL DATA: Screening.

EXAM:
DIGITAL SCREENING BILATERAL MAMMOGRAM WITH TOMO AND CAD

[L MLO synth-2D]
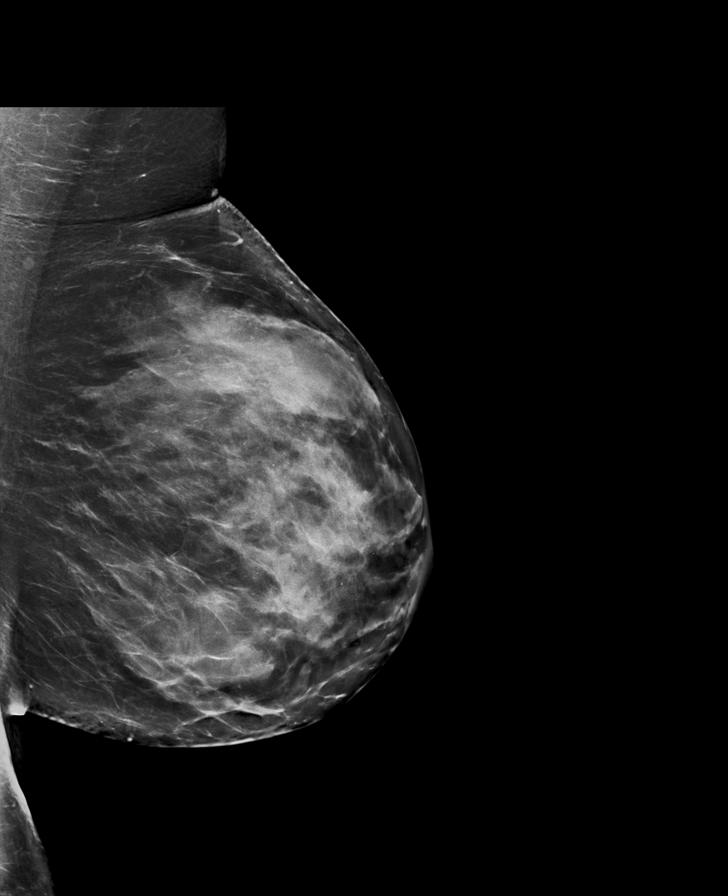

[R CC synth-2D]
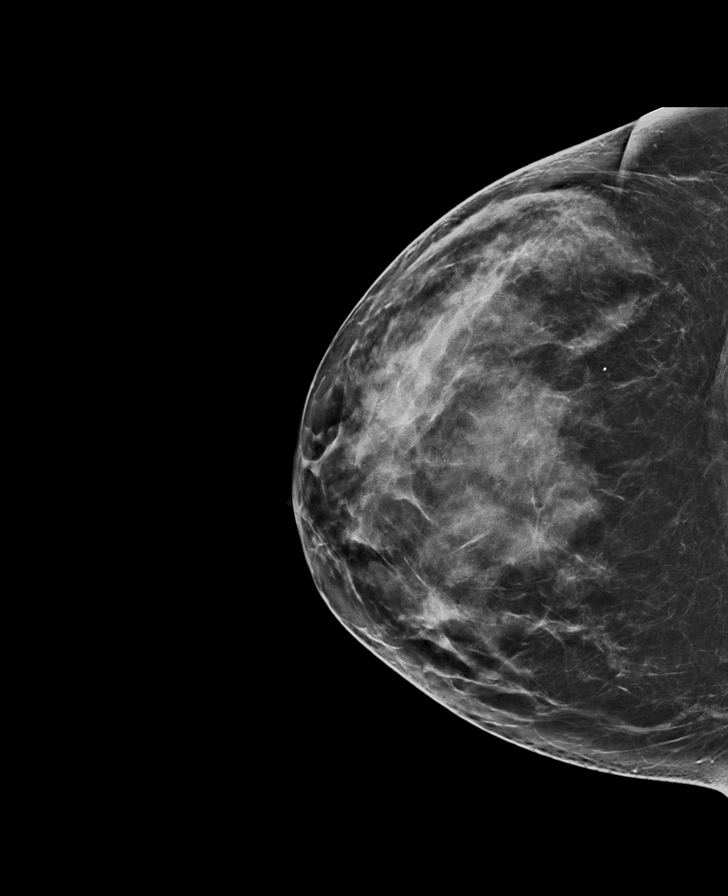

[R MLO synth-2D]
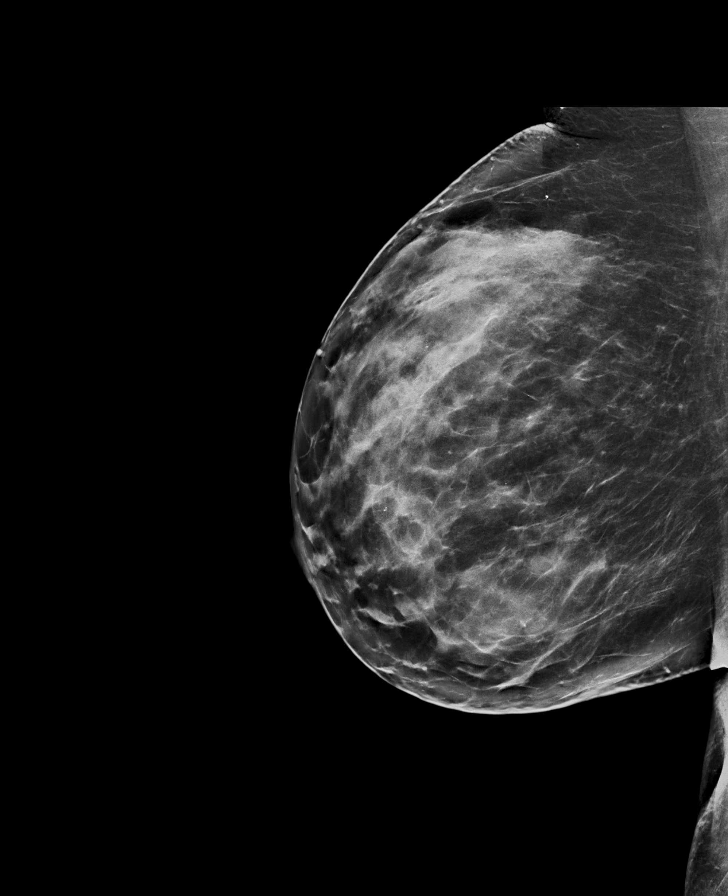

[L CC synth-2D]
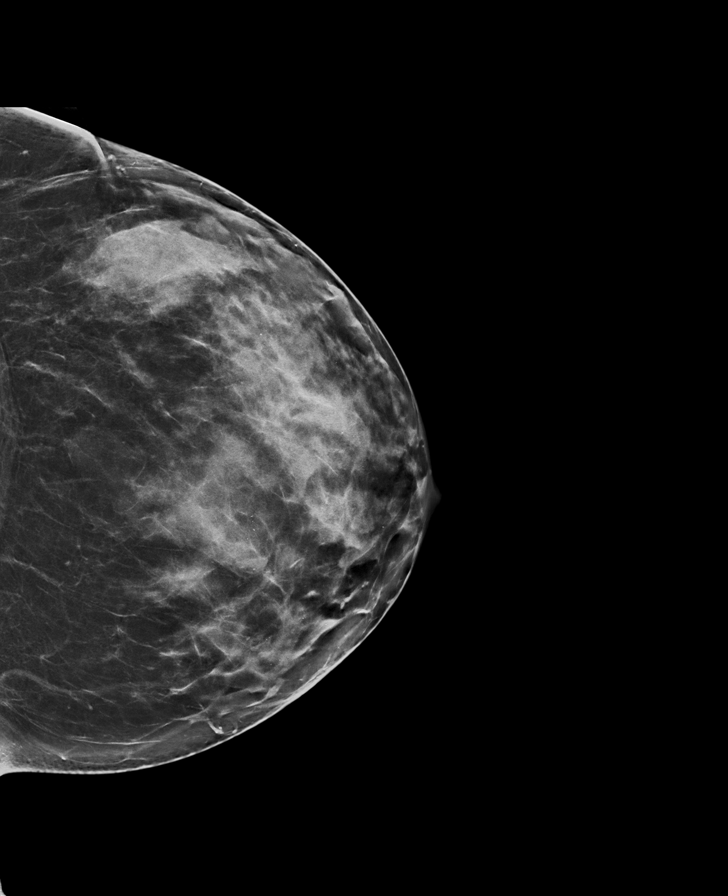

[R CC tomo · tomo slice 43/85.0]
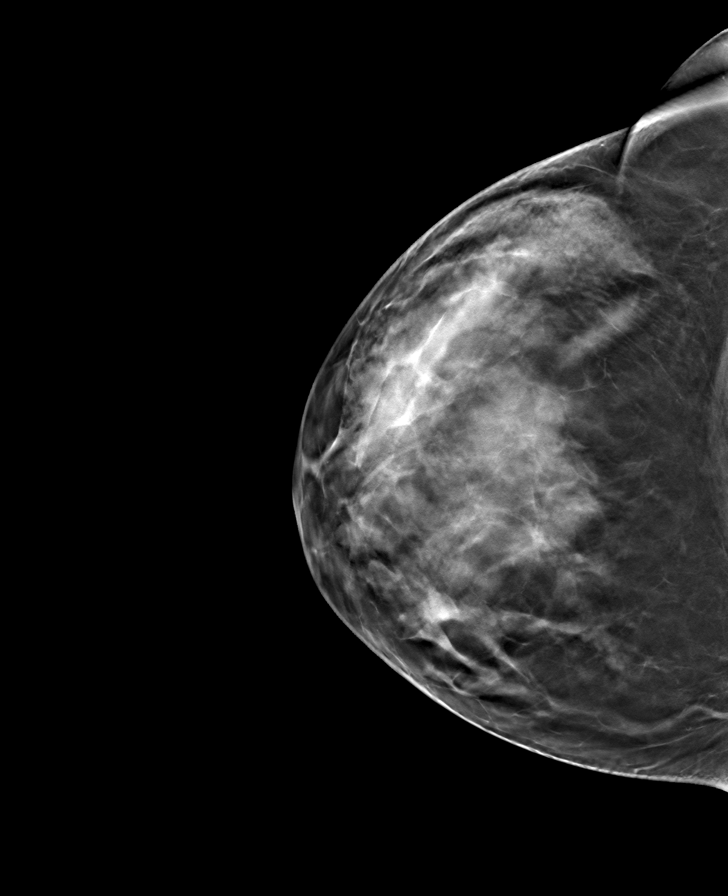

[R MLO tomo · tomo slice 45/89.0]
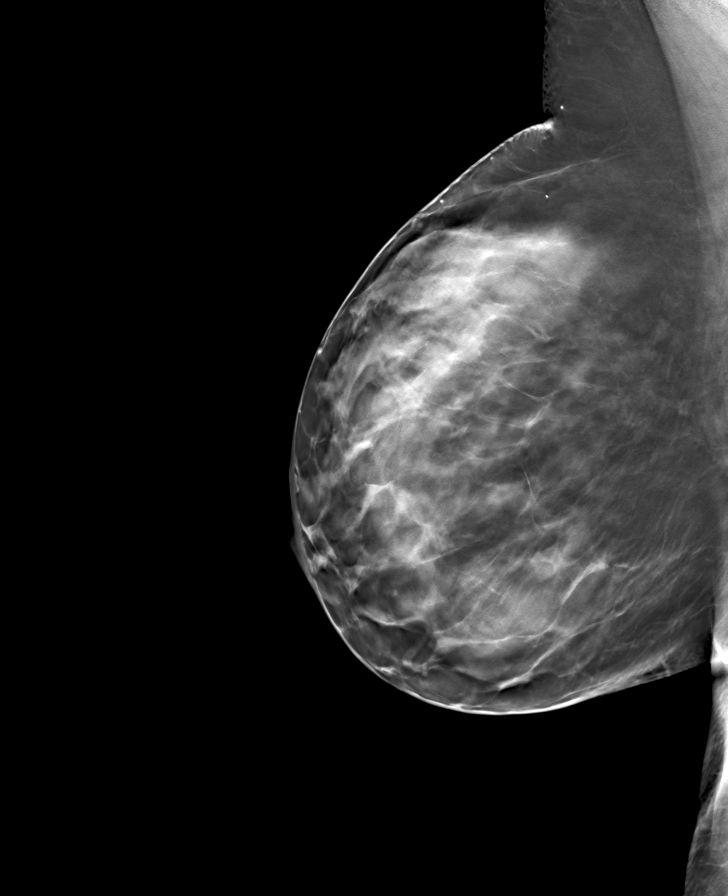

[L MLO tomo · tomo slice 48/95.0]
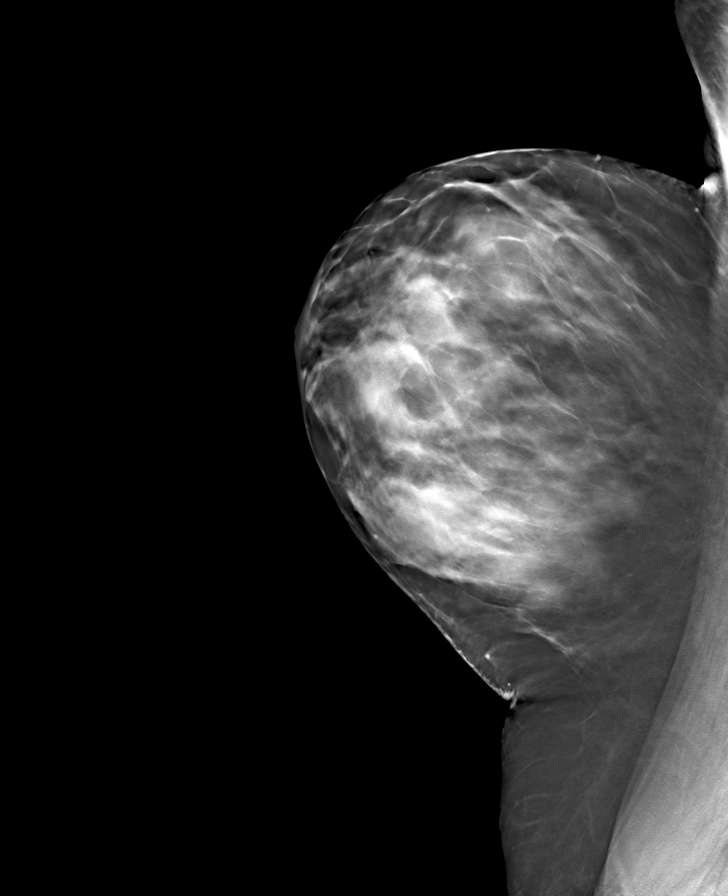

[L CC tomo · tomo slice 43/86.0]
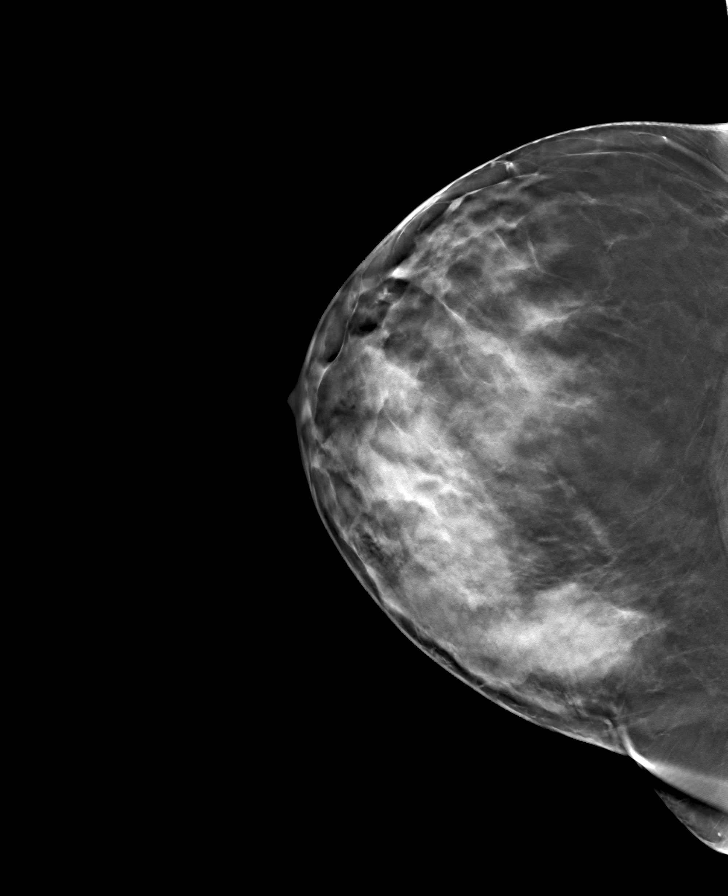

[8 of 24 positions shown; findings below may reference images not displayed]

ACR Breast Density Category c: The breast tissue is heterogeneously
dense, which may obscure small masses.
FINDINGS: There are no findings suspicious for malignancy. Images were
processed with CAD.
IMPRESSION: No mammographic evidence of malignancy. A result letter of this
screening mammogram will be mailed directly to the patient.

RECOMMENDATION:
Screening mammogram in one year. (Code:FT-U-LHB)

BI-RADS CATEGORY  1: Negative.

## 2021-04-30 ENCOUNTER — Encounter: Payer: Self-pay | Admitting: Obstetrics and Gynecology

## 2021-04-30 ENCOUNTER — Other Ambulatory Visit: Payer: Self-pay

## 2021-04-30 ENCOUNTER — Ambulatory Visit (INDEPENDENT_AMBULATORY_CARE_PROVIDER_SITE_OTHER): Payer: 59 | Admitting: Obstetrics and Gynecology

## 2021-04-30 VITALS — BP 130/78 | HR 79 | Ht 63.0 in | Wt 222.0 lb

## 2021-04-30 DIAGNOSIS — Z01419 Encounter for gynecological examination (general) (routine) without abnormal findings: Secondary | ICD-10-CM

## 2021-04-30 DIAGNOSIS — Z1331 Encounter for screening for depression: Secondary | ICD-10-CM | POA: Diagnosis not present

## 2021-04-30 DIAGNOSIS — Z1339 Encounter for screening examination for other mental health and behavioral disorders: Secondary | ICD-10-CM

## 2021-04-30 NOTE — Patient Instructions (Signed)
[facility] in [place], [place] Address: First Floor - [facility], [address], [place], [place] [address] Phone: [phone]  

## 2021-04-30 NOTE — Progress Notes (Signed)
Gynecology Annual Exam  PCP: Glori Luis, MD  Chief Complaint  Patient presents with   Annual Exam   History of Present Illness:  Ms. Calina Patrie is a 50 y.o. [id number] who LMP was Patient's last menstrual period was 04/22/2021 (approximate)., presents today for her annual examination.  Her menses are regular every 28-30 days, lasting 3 day(s).  Dysmenorrhea none. She does not have intermenstrual bleeding.  She not have vasomotor sx.   She is not sexually active. She does not have vaginal dryness.  Contraception - tubal ligation.  Last Pap: 07/2018  Results were: no abnormalities /neg HPV DNA negative Hx of STDs: none  Last mammogram: 08/2018  Results were: normal--routine follow-up in 12 months There is no FH of breast cancer. There is no FH of ovarian cancer. The patient does do self-breast exams.  Colonoscopy: never.  Was scheduled last year, but had to cancel.  DEXA: has not been screened for osteoporosis  Tobacco use: The patient denies current or previous tobacco use. Alcohol use: social drinker Exercise: patient has been walking   The patient wears seatbelts: yes.     Past Medical History:  Diagnosis Date   Chicken pox    Obesity (BMI 30-39.9)    Vertigo    Vitamin D deficiency     Past Surgical History:  Procedure Laterality Date   CESAREAN SECTION  2008   CESAREAN SECTION  2011   repeat c-seaction   CHOLECYSTECTOMY  1998   DILATION AND CURETTAGE OF UTERUS  2010   DILATION AND CURETTAGE OF UTERUS  2011   EYE SURGERY  1978    Prior to Admission medications   Medication Sig Start Date End Date Taking? Authorizing Provider  cholecalciferol (VITAMIN D3) 25 MCG (1000 UNIT) tablet Take 1,000 Units by mouth daily.   Yes [provider]  fexofenadine (ALLEGRA) 30 MG tablet Take 30 mg by mouth daily.    Yes [provider]  meclizine (ANTIVERT) 25 MG tablet Take 25 mg by mouth 3 (three) times daily as needed for dizziness (takes 1/2 tab at a  time).    Yes [provider]   Allergies: No Known Allergies  Obstetric History: [id number]  Family History  Problem Relation Age of Onset   Uterine cancer Mother 59       Negative Genetic Testing   Hypertension Mother    Diabetes Mother    Hypertension Father     Social History   Socioeconomic History   Marital status: Married    Spouse name: Not on file   Number of children: Not on file   Years of education: Not on file   Highest education level: Not on file  Occupational History   Not on file  Tobacco Use   Smoking status: Former   Smokeless tobacco: Never  Vaping Use   Vaping Use: Never used  Substance and Sexual Activity   Alcohol use: Yes    Alcohol/week: 0.0 - 1.0 standard drinks   Drug use: No   Sexual activity: Yes    Partners: Male    Birth control/protection: Surgical    Comment: Tubal Ligation  Other Topics Concern   Not on file  Social History Narrative   Not on file   Social Determinants of Health   Financial Resource Strain: Not on file  Food Insecurity: Not on file  Transportation Needs: Not on file  Physical Activity: Not on file  Stress: Not on file  Social Connections: Not on file  Intimate Partner Violence: Not on file    Review of Systems  Constitutional: Negative.   HENT: Negative.    Eyes: Negative.   Respiratory: Negative.    Cardiovascular: Negative.   Gastrointestinal: Negative.   Genitourinary: Negative.   Musculoskeletal: Negative.   Skin: Negative.   Neurological: Negative.   Psychiatric/Behavioral: Negative.      Physical Exam BP 130/78 (Cuff Size: Large)   Pulse 79   Ht 5\' 3"  (1.6 m)   Wt 222 lb (100.7 kg)   LMP 04/22/2021 (Approximate)   BMI 39.33 kg/m   Physical Exam Constitutional:      General: She is not in acute distress.    Appearance: Normal appearance. She is well-developed.  Genitourinary:     Vulva and bladder normal.     Right Labia: No rash, tenderness, lesions, skin changes or  Bartholin's cyst.    Left Labia: No tenderness, lesions, skin changes, Bartholin's cyst or rash.    No inguinal adenopathy present in the right or left side.    Pelvic Tanner Score: 5/5.    No vaginal discharge, erythema, tenderness or bleeding.      Right Adnexa: not tender, not full and no mass present.    Left Adnexa: not tender, not full and no mass present.    No cervical motion tenderness, discharge, lesion or polyp.     Uterus is not enlarged or tender.     No uterine mass detected.    Pelvic exam was performed with patient in the lithotomy position.  Breasts:    Right: No inverted nipple, mass, nipple discharge, skin change or tenderness.     Left: No inverted nipple, mass, nipple discharge, skin change or tenderness.  HENT:     Head: Normocephalic and atraumatic.  Eyes:     General: No scleral icterus.    Conjunctiva/sclera: Conjunctivae normal.  Neck:     Thyroid: No thyromegaly.  Cardiovascular:     Rate and Rhythm: Normal rate and regular rhythm.     Heart sounds: No murmur heard.   No friction rub. No gallop.  Pulmonary:     Effort: Pulmonary effort is normal. No respiratory distress.     Breath sounds: Normal breath sounds. No wheezing or rales.  Abdominal:     General: Bowel sounds are normal. There is no distension.     Palpations: Abdomen is soft. There is no mass.     Tenderness: There is no abdominal tenderness. There is no guarding or rebound.     Hernia: There is no hernia in the left inguinal area or right inguinal area.  Musculoskeletal:        General: No swelling or tenderness. Normal range of motion.     Cervical back: Normal range of motion and neck supple.  Lymphadenopathy:     Cervical: No cervical adenopathy.     Lower Body: No right inguinal adenopathy. No left inguinal adenopathy.  Neurological:     General: No focal deficit present.     Mental Status: She is alert and oriented to person, place, and time.     Cranial Nerves: No cranial nerve  deficit.  Skin:    General: Skin is warm and dry.     Findings: No erythema or rash.  Psychiatric:        Mood and Affect: Mood normal.        Behavior: Behavior normal.        Judgment: Judgment normal.    Female chaperone present  for pelvic and breast  portions of the physical exam  Results: AUDIT Questionnaire (screen for alcoholism): 0 PHQ-9: 1  Assessment: 50 y.o. [id number] female here for routine gynecologic examination.  Plan: Problem List Items Addressed This Visit   None Visit Diagnoses     Women's annual routine gynecological examination    -  Primary   Screening for depression       Screening for alcoholism           Screening: -- Blood pressure screen normal -- Colonoscopy - due - managed by PCP -- Mammogram - due. Patient to call [facility] to arrange. She understands that it is her responsibility to arrange this. -- Weight screening: overweight: continue to monitor -- Depression screening negative (PHQ-9) -- Nutrition: normal -- cholesterol screening: per PCP -- osteoporosis screening: not due -- tobacco screening: not using -- alcohol screening: AUDIT questionnaire indicates low-risk usage. -- family history of breast cancer screening: done. not at high risk. -- no evidence of domestic violence or intimate partner violence. -- STD screening: gonorrhea/chlamydia NAAT not collected per patient request. -- pap smear not collected per ASCCP guidelines -- HPV vaccination series: not eligilbe  Thomasene Mohair, MD 04/30/2021 10:04 AM

## 2021-05-06 ENCOUNTER — Telehealth: Payer: Self-pay | Admitting: Family Medicine

## 2021-05-06 NOTE — Telephone Encounter (Signed)
I called and spoke with the patient and she stated the nodules ar all gone, but she did schedule her yearly physical with the provider in November.  Sharlyn Odonnel,cma

## 2021-05-06 NOTE — Telephone Encounter (Signed)
This patient had an ultrasound ordered about a year ago that appears to have not been done.  This was for nodules in her arms.  If these are still present she needs to have this completed.  She appears to be due for a yearly physical as well.

## 2021-07-08 ENCOUNTER — Encounter: Payer: 59 | Admitting: Family Medicine

## 2021-10-07 ENCOUNTER — Other Ambulatory Visit: Payer: Self-pay

## 2021-10-07 ENCOUNTER — Telehealth: Payer: Self-pay | Admitting: *Deleted

## 2021-10-07 ENCOUNTER — Other Ambulatory Visit (INDEPENDENT_AMBULATORY_CARE_PROVIDER_SITE_OTHER): Payer: 59

## 2021-10-07 ENCOUNTER — Encounter: Payer: Self-pay | Admitting: Family Medicine

## 2021-10-07 DIAGNOSIS — E559 Vitamin D deficiency, unspecified: Secondary | ICD-10-CM

## 2021-10-07 DIAGNOSIS — Z13220 Encounter for screening for lipoid disorders: Secondary | ICD-10-CM | POA: Diagnosis not present

## 2021-10-07 DIAGNOSIS — Z130 Encounter for screening for diseases of the blood and blood-forming organs and certain disorders involving the immune mechanism: Secondary | ICD-10-CM | POA: Diagnosis not present

## 2021-10-07 DIAGNOSIS — Z1329 Encounter for screening for other suspected endocrine disorder: Secondary | ICD-10-CM | POA: Diagnosis not present

## 2021-10-07 DIAGNOSIS — E669 Obesity, unspecified: Secondary | ICD-10-CM | POA: Diagnosis not present

## 2021-10-07 NOTE — Telephone Encounter (Signed)
Pt added to lab schedule today at 2:45 with no orders. ? ?Please place future orders. Pt hasn't been seen since 2021. ?

## 2021-10-07 NOTE — Telephone Encounter (Signed)
Orders signed.

## 2021-10-08 LAB — COMPREHENSIVE METABOLIC PANEL
ALT: 12 U/L (ref 0–35)
AST: 11 U/L (ref 0–37)
Albumin: 4 g/dL (ref 3.5–5.2)
Alkaline Phosphatase: 88 U/L (ref 39–117)
BUN: 10 mg/dL (ref 6–23)
CO2: 30 mEq/L (ref 19–32)
Calcium: 9.6 mg/dL (ref 8.4–10.5)
Chloride: 98 mEq/L (ref 96–112)
Creatinine, Ser: 0.88 mg/dL (ref 0.40–1.20)
GFR: 77.52 mL/min (ref >60.00)
Glucose, Bld: 86 mg/dL (ref 70–99)
Potassium: 3.8 mEq/L (ref 3.5–5.1)
Sodium: 137 mEq/L (ref 135–145)
Total Bilirubin: 0.5 mg/dL (ref 0.2–1.2)
Total Protein: 7.1 g/dL (ref 6.0–8.3)

## 2021-10-08 LAB — CBC
HCT: 39.5 % (ref 36.0–46.0)
Hemoglobin: 13.6 g/dL (ref 12.0–15.0)
MCHC: 34.4 g/dL (ref 30.0–36.0)
MCV: 85.5 fl (ref 78.0–100.0)
Platelets: 305 10*3/uL (ref 150.0–400.0)
RBC: 4.61 Mil/uL (ref 3.87–5.11)
RDW: 13.4 % (ref 11.5–15.5)
WBC: 8.2 10*3/uL (ref 4.0–10.5)

## 2021-10-08 LAB — LIPID PANEL
Cholesterol: 157 mg/dL (ref 0–200)
HDL: 51 mg/dL (ref >39.00)
LDL Cholesterol: 84 mg/dL (ref 0–99)
NonHDL: 106.41
Total CHOL/HDL Ratio: 3
Triglycerides: 111 mg/dL (ref 0.0–149.0)
VLDL: 22.2 mg/dL (ref 0.0–40.0)

## 2021-10-08 LAB — VITAMIN D 25 HYDROXY (VIT D DEFICIENCY, FRACTURES): VITD: 30.35 ng/mL (ref 30.00–100.00)

## 2021-10-08 LAB — TSH: TSH: 3.73 u[IU]/mL (ref 0.35–5.50)

## 2021-10-08 LAB — HEMOGLOBIN A1C: Hgb A1c MFr Bld: 5.4 % (ref 4.6–6.5)

## 2021-10-12 ENCOUNTER — Encounter: Payer: Self-pay | Admitting: Family Medicine

## 2021-10-12 ENCOUNTER — Telehealth: Payer: Self-pay

## 2021-10-12 ENCOUNTER — Ambulatory Visit (INDEPENDENT_AMBULATORY_CARE_PROVIDER_SITE_OTHER): Payer: 59 | Admitting: Family Medicine

## 2021-10-12 ENCOUNTER — Other Ambulatory Visit: Payer: Self-pay

## 2021-10-12 VITALS — BP 110/80 | HR 76 | Temp 98.0°F | Ht 63.0 in | Wt 226.2 lb

## 2021-10-12 DIAGNOSIS — Z0001 Encounter for general adult medical examination with abnormal findings: Secondary | ICD-10-CM

## 2021-10-12 DIAGNOSIS — E669 Obesity, unspecified: Secondary | ICD-10-CM

## 2021-10-12 DIAGNOSIS — Z1231 Encounter for screening mammogram for malignant neoplasm of breast: Secondary | ICD-10-CM

## 2021-10-12 DIAGNOSIS — Z1211 Encounter for screening for malignant neoplasm of colon: Secondary | ICD-10-CM

## 2021-10-12 DIAGNOSIS — Z130 Encounter for screening for diseases of the blood and blood-forming organs and certain disorders involving the immune mechanism: Secondary | ICD-10-CM

## 2021-10-12 DIAGNOSIS — E559 Vitamin D deficiency, unspecified: Secondary | ICD-10-CM

## 2021-10-12 DIAGNOSIS — Z13220 Encounter for screening for lipoid disorders: Secondary | ICD-10-CM

## 2021-10-12 DIAGNOSIS — Z1329 Encounter for screening for other suspected endocrine disorder: Secondary | ICD-10-CM

## 2021-10-12 DIAGNOSIS — Z0000 Encounter for general adult medical examination without abnormal findings: Secondary | ICD-10-CM

## 2021-10-12 NOTE — Progress Notes (Signed)
?Nancy Rumps, MD ?Phone: [phone] ? ?Nancy Donovan is a 51 y.o. female who presents today for CPE. ? ?Diet: generally healthy, lots of plane foods, plenty of fruits and vegetables, not many sweets or junk, has cut down on soda from 4 to 2 ?Exercise: not much recently as she was dealing with the death of her mother and cleaning out her mothers house ?Pap smear: 07/10/18 NILM Neg HPV ?Colonoscopy: due ?Mammogram: due ?Family history- ? Colon cancer: no ? Breast cancer: no ? Ovarian cancer: no ?Menses: once monthly 3 days ?Vaccines-  ? Flu: declines ? Tetanus: due ? COVID19: declines ?HIV screening: UTD ?Hep C Screening: declines ?Tobacco use: no ?Alcohol use: occasional ?Illicit Drug use: no ?Dentist: yes ?Ophthalmology: yes ? ? ?Active Ambulatory Problems  ?  Diagnosis Date Noted  ? Obesity (BMI 30-39.9)   ? Routine general medical examination at a health care facility 02/15/2017  ? Skin lesion 06/21/2018  ? Cerumen impaction 06/21/2018  ? Anxiety and depression 05/09/2020  ? Subcutaneous nodule 05/09/2020  ? Fatigue 05/09/2020  ? Colon cancer screening 05/09/2020  ? Elevated BP without diagnosis of hypertension 05/09/2020  ? ?Resolved Ambulatory Problems  ?  Diagnosis Date Noted  ? Encounter for preventative adult health care exam with abnormal findings 12/26/2015  ? Ankle pain 12/26/2015  ? Rash 12/26/2015  ? ?Past Medical History:  ?Diagnosis Date  ? Chicken pox   ? Vertigo   ? Vitamin D deficiency   ? ? ?Family History  ?Problem Relation Age of Onset  ? Uterine cancer Mother 24  ?     Negative Genetic Testing  ? Hypertension Mother   ? Diabetes Mother   ? Hypertension Father   ? ? ?Social History  ? ?Socioeconomic History  ? Marital status: Married  ?  Spouse name: Not on file  ? Number of children: Not on file  ? Years of education: Not on file  ? Highest education level: Not on file  ?Occupational History  ? Not on file  ?Tobacco Use  ? Smoking status: Former  ? Smokeless tobacco: Never  ?Vaping Use  ?  Vaping Use: Never used  ?Substance and Sexual Activity  ? Alcohol use: Yes  ?  Alcohol/week: 0.0 - 1.0 standard drinks  ? Drug use: No  ? Sexual activity: Yes  ?  Partners: Male  ?  Birth control/protection: Surgical  ?  Comment: Tubal Ligation  ?Other Topics Concern  ? Not on file  ?Social History Narrative  ? Not on file  ? ?Social Determinants of Health  ? ?Financial Resource Strain: Not on file  ?Food Insecurity: Not on file  ?Transportation Needs: Not on file  ?Physical Activity: Not on file  ?Stress: Not on file  ?Social Connections: Not on file  ?Intimate Partner Violence: Not on file  ? ? ?ROS ? ?General:  Negative for nexplained weight loss, fever ?Skin: Negative for new or changing mole, sore that won't heal ?HEENT: Negative for trouble hearing, trouble seeing, ringing in ears, mouth sores, hoarseness, change in voice, dysphagia. ?CV:  Negative for chest pain, dyspnea, edema, palpitations ?Resp: Negative for cough, dyspnea, hemoptysis ?GI: Negative for nausea, vomiting, diarrhea, constipation, abdominal pain, melena, hematochezia. ?GU: Negative for dysuria, incontinence, urinary hesitance, hematuria, vaginal or penile discharge, polyuria, sexual difficulty, lumps in testicle or breasts ?MSK: Negative for muscle cramps or aches, joint pain or swelling ?Neuro: Negative for headaches, weakness, numbness, dizziness, passing out/fainting ?Psych: Negative for depression, anxiety, memory problems ? ?Objective ? ?  Physical Exam ?Vitals:  ? 10/12/21 1015  ?BP: 110/80  ?Pulse: 76  ?Temp: 98 ?F (36.7 ?C)  ?SpO2: 98%  ? ? ?BP Readings from Last 3 Encounters:  ?10/12/21 110/80  ?04/30/21 130/78  ?05/09/20 130/80  ? ?Wt Readings from Last 3 Encounters:  ?10/12/21 226 lb 3.2 oz (102.6 kg)  ?04/30/21 222 lb (100.7 kg)  ?05/09/20 226 lb 6.4 oz (102.7 kg)  ? ? ?Physical Exam ?Constitutional:   ?   General: She is not in acute distress. ?   Appearance: She is not diaphoretic.  ?Eyes:  ?   Comments: Right pupil with chronic  defect, left pupil normal  ?Cardiovascular:  ?   Rate and Rhythm: Normal rate and regular rhythm.  ?   Heart sounds: Normal heart sounds.  ?Pulmonary:  ?   Effort: Pulmonary effort is normal.  ?   Breath sounds: Normal breath sounds.  ?Abdominal:  ?   General: Bowel sounds are normal. There is no distension.  ?   Palpations: Abdomen is soft.  ?   Tenderness: There is no abdominal tenderness.  ?Musculoskeletal:  ?   Right lower leg: No edema.  ?   Left lower leg: No edema.  ?Lymphadenopathy:  ?   Cervical: No cervical adenopathy.  ?Skin: ?   General: Skin is warm and dry.  ?Neurological:  ?   Mental Status: She is alert.  ?Psychiatric:     ?   Mood and Affect: Mood normal.  ? ? ? ?Assessment/Plan:  ? ?Problem List Items Addressed This Visit   ? ? Colon cancer screening  ? Relevant Orders  ? Ambulatory referral to Gastroenterology  ? Obesity (BMI 30-39.9)  ? Relevant Orders  ? HgB A1c (Completed)  ? Routine general medical examination at a health care facility - Primary  ?  Physical exam completed. Encouraged healthy diet and exercise. Mammogram ordered and patient will call to schedule this. GI referral for colon cancer screening. She will call back to schedule a Td. Declines covid and flu vaccines. Declines Hep C screening. Labs reviewed.  ?  ?  ? ?Other Visit Diagnoses   ? ? Lipid screening      ? Relevant Orders  ? Comp Met (CMET) (Completed)  ? Lipid panel (Completed)  ? Screening for deficiency anemia      ? Relevant Orders  ? CBC (Completed)  ? Thyroid disorder screen      ? Relevant Orders  ? TSH (Completed)  ? Vitamin D deficiency      ? Relevant Orders  ? Vitamin D (25 hydroxy) (Completed)  ? Encounter for screening mammogram for malignant neoplasm of breast      ? Relevant Orders  ? MM 3D SCREEN BREAST BILATERAL  ? ?  ? ? ?Return in about 1 year (around 10/13/2022) for CPE. ? ?This visit occurred during the SARS-CoV-2 public health emergency.  Safety protocols were in place, including screening questions  prior to the visit, additional usage of staff PPE, and extensive cleaning of exam room while observing appropriate contact time as indicated for disinfecting solutions.  ? ? ?Nancy Rumps, MD ?[facility] ? ?

## 2021-10-12 NOTE — Telephone Encounter (Signed)
Patient wants to call us to schedule took off call list ?

## 2021-10-12 NOTE — Assessment & Plan Note (Signed)
Physical exam completed. Encouraged healthy diet and exercise. Mammogram ordered and patient will call to schedule this. GI referral for colon cancer screening. She will call back to schedule a Td. Declines covid and flu vaccines. Declines Hep C screening. Labs reviewed.  ?

## 2021-10-12 NOTE — Patient Instructions (Signed)
Nice to see you. ?Please call [phone] to schedule your mammogram. ?GI should contact you to schedule colonoscopy. ?Please continue to work on cutting down on sodas. ?Please start walking for exercise. ?

## 2022-10-15 ENCOUNTER — Encounter: Payer: 59 | Admitting: Family Medicine

## 2022-10-22 ENCOUNTER — Encounter: Payer: 59 | Admitting: Family Medicine

## 2022-12-22 ENCOUNTER — Ambulatory Visit (INDEPENDENT_AMBULATORY_CARE_PROVIDER_SITE_OTHER): Payer: 59 | Admitting: Family Medicine

## 2022-12-22 ENCOUNTER — Encounter: Payer: Self-pay | Admitting: Family Medicine

## 2022-12-22 VITALS — BP 122/74 | HR 83 | Temp 98.5°F | Ht 63.0 in | Wt 237.8 lb

## 2022-12-22 DIAGNOSIS — E669 Obesity, unspecified: Secondary | ICD-10-CM | POA: Diagnosis not present

## 2022-12-22 DIAGNOSIS — Z130 Encounter for screening for diseases of the blood and blood-forming organs and certain disorders involving the immune mechanism: Secondary | ICD-10-CM

## 2022-12-22 DIAGNOSIS — Z23 Encounter for immunization: Secondary | ICD-10-CM

## 2022-12-22 DIAGNOSIS — Z1329 Encounter for screening for other suspected endocrine disorder: Secondary | ICD-10-CM

## 2022-12-22 DIAGNOSIS — Z13220 Encounter for screening for lipoid disorders: Secondary | ICD-10-CM | POA: Diagnosis not present

## 2022-12-22 DIAGNOSIS — Z124 Encounter for screening for malignant neoplasm of cervix: Secondary | ICD-10-CM

## 2022-12-22 DIAGNOSIS — Z1211 Encounter for screening for malignant neoplasm of colon: Secondary | ICD-10-CM

## 2022-12-22 DIAGNOSIS — Z0000 Encounter for general adult medical examination without abnormal findings: Secondary | ICD-10-CM

## 2022-12-22 LAB — CBC
HCT: 39.9 % (ref 36.0–46.0)
Hemoglobin: 13.4 g/dL (ref 12.0–15.0)
MCHC: 33.6 g/dL (ref 30.0–36.0)
MCV: 83.4 fl (ref 78.0–100.0)
Platelets: 321 10*3/uL (ref 150.0–400.0)
RBC: 4.78 Mil/uL (ref 3.87–5.11)
RDW: 14.3 % (ref 11.5–15.5)
WBC: 9.9 10*3/uL (ref 4.0–10.5)

## 2022-12-22 LAB — LIPID PANEL
Cholesterol: 153 mg/dL (ref 0–200)
HDL: 46.4 mg/dL (ref >39.00)
LDL Cholesterol: 73 mg/dL (ref 0–99)
NonHDL: 106.9
Total CHOL/HDL Ratio: 3
Triglycerides: 169 mg/dL — ABNORMAL HIGH (ref 0.0–149.0)
VLDL: 33.8 mg/dL (ref 0.0–40.0)

## 2022-12-22 LAB — COMPREHENSIVE METABOLIC PANEL
ALT: 14 U/L (ref 0–35)
AST: 14 U/L (ref 0–37)
Albumin: 3.9 g/dL (ref 3.5–5.2)
Alkaline Phosphatase: 85 U/L (ref 39–117)
BUN: 12 mg/dL (ref 6–23)
CO2: 28 mEq/L (ref 19–32)
Calcium: 9.5 mg/dL (ref 8.4–10.5)
Chloride: 100 mEq/L (ref 96–112)
Creatinine, Ser: 0.97 mg/dL (ref 0.40–1.20)
GFR: 68.39 mL/min (ref >60.00)
Glucose, Bld: 100 mg/dL — ABNORMAL HIGH (ref 70–99)
Potassium: 4.1 mEq/L (ref 3.5–5.1)
Sodium: 137 mEq/L (ref 135–145)
Total Bilirubin: 0.4 mg/dL (ref 0.2–1.2)
Total Protein: 7.3 g/dL (ref 6.0–8.3)

## 2022-12-22 LAB — HEMOGLOBIN A1C: Hgb A1c MFr Bld: 5.6 % (ref 4.6–6.5)

## 2022-12-22 LAB — TSH: TSH: 3.33 u[IU]/mL (ref 0.35–5.50)

## 2022-12-22 NOTE — Progress Notes (Signed)
Marikay Alar, MD Phone: [phone]  Nancy Donovan is a 52 y.o. female who presents today for CPE.  Diet: skips breakfast, eats lean meats, does get some fruits and vegetables Exercise: none, plans to start back soon Pap smear: due later this year with GYN Colonoscopy: due Mammogram: due Family history-  Colon cancer: no  Breast cancer: no  Ovarian cancer: no Menses: typically once monthly, though notes occasionally skips a month Vaccines-   Flu: out of season  Tetanus: due  COVID19: declines HIV screening: UTD Hep C Screening: UTD, blood donation in the past 10 years Tobacco use: no Alcohol use: rare Illicit Drug use: no Dentist: yes Ophthalmology: yes IBS: Patient notes alternating between constipation and loose stools.  She has is under control with fiber supplementation.   Active Ambulatory Problems    Diagnosis Date Noted   Obesity (BMI 30-39.9)    Routine general medical examination at a health care facility 02/15/2017   Anxiety and depression 05/09/2020   Subcutaneous nodule 05/09/2020   Fatigue 05/09/2020   Colon cancer screening 05/09/2020   Elevated BP without diagnosis of hypertension 05/09/2020   Resolved Ambulatory Problems    Diagnosis Date Noted   Encounter for preventative adult health care exam with abnormal findings 12/26/2015   Ankle pain 12/26/2015   Rash 12/26/2015   Skin lesion 06/21/2018   Cerumen impaction 06/21/2018   Past Medical History:  Diagnosis Date   Chicken pox    Vertigo    Vitamin D deficiency     Family History  Problem Relation Age of Onset   Uterine cancer Mother 33       Negative Genetic Testing   Hypertension Mother    Diabetes Mother    Hypertension Father     Social History   Socioeconomic History   Marital status: Married    Spouse name: Not on file   Number of children: Not on file   Years of education: Not on file   Highest education level: Not on file  Occupational History   Not on file   Tobacco Use   Smoking status: Former   Smokeless tobacco: Never  Vaping Use   Vaping Use: Never used  Substance and Sexual Activity   Alcohol use: Yes    Alcohol/week: 0.0 - 1.0 standard drinks of alcohol   Drug use: No   Sexual activity: Yes    Partners: Male    Birth control/protection: Surgical    Comment: Tubal Ligation  Other Topics Concern   Not on file  Social History Narrative   Not on file   Social Determinants of Health   Financial Resource Strain: Not on file  Food Insecurity: Not on file  Transportation Needs: Not on file  Physical Activity: Not on file  Stress: Not on file  Social Connections: Not on file  Intimate Partner Violence: Not on file    ROS  General:  Negative for nexplained weight loss, fever Skin: Negative for new or changing mole, sore that won't heal HEENT: Negative for trouble hearing, trouble seeing, ringing in ears, mouth sores, hoarseness, change in voice, dysphagia. CV:  Negative for chest pain, dyspnea, edema, palpitations Resp: Negative for cough, dyspnea, hemoptysis GI: Negative for nausea, vomiting, diarrhea, constipation, abdominal pain, melena, hematochezia. GU: Negative for dysuria, incontinence, urinary hesitance, hematuria, vaginal or penile discharge, polyuria, sexual difficulty, lumps in testicle or breasts MSK: Negative for muscle cramps or aches, joint pain or swelling Neuro: Negative for headaches, weakness, numbness, dizziness, passing out/fainting Psych:  Negative for depression, anxiety, memory problems  Objective  Physical Exam Vitals:   12/22/22 0857  BP: 122/74  Pulse: 83  Temp: 98.5 F (36.9 C)  SpO2: 98%    BP Readings from Last 3 Encounters:  12/22/22 122/74  10/12/21 110/80  04/30/21 130/78   Wt Readings from Last 3 Encounters:  12/22/22 237 lb 12.8 oz (107.9 kg)  10/12/21 226 lb 3.2 oz (102.6 kg)  04/30/21 222 lb (100.7 kg)    Physical Exam Constitutional:      General: She is not in acute  distress.    Appearance: She is not diaphoretic.  HENT:     Head: Normocephalic and atraumatic.  Cardiovascular:     Rate and Rhythm: Normal rate and regular rhythm.     Heart sounds: Normal heart sounds.  Pulmonary:     Effort: Pulmonary effort is normal.     Breath sounds: Normal breath sounds.  Abdominal:     General: Bowel sounds are normal. There is no distension.     Palpations: Abdomen is soft.     Tenderness: There is no abdominal tenderness.  Musculoskeletal:     Right lower leg: No edema.     Left lower leg: No edema.  Lymphadenopathy:     Cervical: No cervical adenopathy.  Skin:    General: Skin is warm and dry.  Neurological:     Mental Status: She is alert.  Psychiatric:        Mood and Affect: Mood normal.      Assessment/Plan:   Routine general medical examination at a health care facility Assessment & Plan: Physical exam completed.  Encouraged healthy diet and exercise.  Discussed exercising 30 minutes 3 times a week with walking.  Discussed building up her stamina before increasing any intensity of her exercise.  She will call to schedule her mammogram.  GI referral placed for colonoscopy.  Referral placed to gynecology for Pap smear.  Tetanus vaccine given today.  She declines COVID vaccinations.  She will discuss her menstrual cycles with gynecology when she sees them.  She is likely perimenopausal.  Lab work as outlined.   Colon cancer screening -     Ambulatory referral to Gastroenterology  Obesity (BMI 30-39.9) -     Hemoglobin A1c  Cervical cancer screening -     Ambulatory referral to Gynecology  Lipid screening -     Comprehensive metabolic panel -     Lipid panel  Thyroid disorder screen -     TSH  Screening for deficiency anemia -     CBC  Encounter for administration of vaccine -     Td vaccine greater than or equal to 7yo preservative free IM    Return in about 1 year (around 12/22/2023) for physical.   Marikay Alar,  MD [facility]

## 2022-12-22 NOTE — Patient Instructions (Signed)
Nice to see you. Please call [phone] to schedule your mammogram. I have referred you to Dr. Jean Rosenthal at [facility].  If you do not hear from them within 2 weeks please let us know. GI should also contact you to schedule your colonoscopy. We will contact you with your lab results.

## 2022-12-22 NOTE — Assessment & Plan Note (Signed)
Physical exam completed.  Encouraged healthy diet and exercise.  Discussed exercising 30 minutes 3 times a week with walking.  Discussed building up her stamina before increasing any intensity of her exercise.  She will call to schedule her mammogram.  GI referral placed for colonoscopy.  Referral placed to gynecology for Pap smear.  Tetanus vaccine given today.  She declines COVID vaccinations.  She will discuss her menstrual cycles with gynecology when she sees them.  She is likely perimenopausal.  Lab work as outlined.

## 2022-12-23 ENCOUNTER — Telehealth: Payer: Self-pay

## 2022-12-23 NOTE — Telephone Encounter (Signed)
Colonoscopy referral discussed with patient.  She requested Miralax Gatorade prep prior to completing triage.  Stating that her husband had the Suprep and  said she will not be able to drink the prep-as he just had his colonoscopy.  I explained to her that our office does not do Miralax Gatorade prep as it is not FDA approved for colonoscopy preps. We prefer Suprep because it requires you to drink the least amount of liquid, however the other preps are larger volume preps meaning she would have to drink more to prepare for her colonoscopy.  I also discussed the SuTab prep with her.  She said she wasn't for sure if she could tolerate any of them.  I told her I could send in rx for Zofran in case she experiences nausea in the process but she declined.  Then she said she was concerned because having all the diarrhea would cause her hemorrhoids to flare up.  I offered to schedule her an office visit to discuss hemorrhoid treatment, but also informed her that she could do OTC hemorrhoid treatment but I could not offer a rx for treatment without an office visit. I advised that I would send her out a list of all the colonoscopy preps that our office uses and she can call me back to schedule her colonoscopy once she has decided a prep.  Reminder letter and colonoscopy prep list to be mailed. Thanks, [place], [place]

## 2023-01-27 ENCOUNTER — Telehealth: Payer: Self-pay

## 2023-01-27 ENCOUNTER — Other Ambulatory Visit: Payer: Self-pay

## 2023-01-27 DIAGNOSIS — Z1211 Encounter for screening for malignant neoplasm of colon: Secondary | ICD-10-CM

## 2023-01-27 MED ORDER — ONDANSETRON HCL 4 MG PO TABS: 4.0000 mg | ORAL_TABLET | Freq: Four times a day (QID) | ORAL | 1 refills | Status: DC | PRN

## 2023-01-27 MED ORDER — PEG 3350-KCL-NA BICARB-NACL 420 G PO SOLR: 4000.0000 mL | Freq: Once | ORAL | 0 refills | Status: AC

## 2023-01-27 NOTE — Telephone Encounter (Signed)
Gastroenterology Pre-Procedure Review  Request Date: 03/18/23 Requesting Physician: Dr. Allegra Lai   PATIENT REVIEW QUESTIONS: The patient responded to the following health history questions as indicated:    1. Are you having any GI issues?  IBS feeling more bloating, bowel habits good-taking fiber 2. Do you have a personal history of Polyps? no 3. Do you have a family history of Colon Cancer or Polyps? no 4. Diabetes Mellitus? no 5. Joint replacements in the past 12 months?no 6. Major health problems in the past 3 months?no 7. Any artificial heart valves, MVP, or defibrillator?no    MEDICATIONS & ALLERGIES:    Patient reports the following regarding taking any anticoagulation/antiplatelet therapy:   Plavix, Coumadin, Eliquis, Xarelto, Lovenox, Pradaxa, Brilinta, or Effient? no Aspirin? no  Patient confirms/reports the following medications:  Current Outpatient Medications  Medication Sig Dispense Refill   cholecalciferol (VITAMIN D3) 25 MCG (1000 UNIT) tablet Take 1,000 Units by mouth daily.     fexofenadine (ALLEGRA) 30 MG tablet Take 30 mg by mouth daily.      meclizine (ANTIVERT) 25 MG tablet Take 25 mg by mouth 3 (three) times daily as needed for dizziness (takes 1/2 tab at a time).      omeprazole (PRILOSEC) 20 MG capsule Take 20 mg by mouth daily.     Rhubarb (ESTROVEN COMPLETE PO) Take by mouth.     No current facility-administered medications for this visit.    Patient confirms/reports the following allergies:  No Known Allergies  No orders of the defined types were placed in this encounter.   AUTHORIZATION INFORMATION Primary Insurance: 1D#: Group #:  Secondary Insurance: 1D#: Group #:  SCHEDULE INFORMATION: Date: 03/18/23 Time: Location: [facility]

## 2023-02-15 ENCOUNTER — Telehealth: Payer: Self-pay

## 2023-02-15 NOTE — Telephone Encounter (Signed)
Patient call has been returned to reschedule her colonoscopy with Dr. Allegra Lai currently scheduled for 03/18/23.  Colonoscopy has been rescheduled to 04/08/23.  Updated instructions.  Referral updated.  Will notify Trish tomorrow with new date.  Thanks, Marcelino Duster CMA

## 2023-02-15 NOTE — Telephone Encounter (Signed)
Pt left message to schedule colonoscopy please return call  

## 2023-04-01 ENCOUNTER — Encounter: Payer: Self-pay | Admitting: Gastroenterology

## 2023-04-04 ENCOUNTER — Telehealth: Payer: Self-pay

## 2023-04-04 NOTE — Telephone Encounter (Signed)
Pt left vm that she needs to reschedule her colonoscopy that is scheduled for 04/08/23.

## 2023-04-04 NOTE — Telephone Encounter (Signed)
Colonoscopy has been rescheduled to 06/17/23.  Trish in endo notified of date change.  Referral updated. Instructions updated.  Thanks, [place], [place]

## 2023-06-10 ENCOUNTER — Encounter: Payer: Self-pay | Admitting: Gastroenterology

## 2023-06-13 ENCOUNTER — Telehealth: Payer: Self-pay

## 2023-06-13 MED ORDER — NA SULFATE-K SULFATE-MG SULF 17.5-3.13-1.6 GM/177ML PO SOLN: 1.0000 | Freq: Once | ORAL | 0 refills | Status: AC

## 2023-06-13 NOTE — Telephone Encounter (Addendum)
Patient called and left a voicemail she would like to have a different prep to do. She will like a call back to discuss why she needs a different prep. I called patient back to let her know we received his message, and I let her know I sent her the message.

## 2023-06-13 NOTE — Telephone Encounter (Signed)
Patient call returned.  She has requested to have Suprep sent to [facility] on [address]. She said she is just having a hard time trying to do this.  Prep has been sent.  Thanks, [place], [place]

## 2023-06-17 ENCOUNTER — Encounter
Admission: RE | Disposition: A | Discharge status: Home / Self Care | Payer: Self-pay | Source: Home / Self Care | Attending: Gastroenterology

## 2023-06-17 ENCOUNTER — Ambulatory Visit: Payer: 59 | Admitting: Certified Registered Nurse Anesthetist

## 2023-06-17 ENCOUNTER — Encounter: Payer: Self-pay | Admitting: Gastroenterology

## 2023-06-17 ENCOUNTER — Telehealth: Payer: Self-pay

## 2023-06-17 ENCOUNTER — Ambulatory Visit
Admission: RE | Admit: 2023-06-17 | Discharge: 2023-06-17 | Disposition: A | Discharge status: Home / Self Care | Payer: 59 | Attending: Gastroenterology | Admitting: Gastroenterology

## 2023-06-17 DIAGNOSIS — Z1211 Encounter for screening for malignant neoplasm of colon: Secondary | ICD-10-CM | POA: Diagnosis present

## 2023-06-17 DIAGNOSIS — Z6841 Body Mass Index (BMI) 40.0 and over, adult: Secondary | ICD-10-CM | POA: Insufficient documentation

## 2023-06-17 DIAGNOSIS — K219 Gastro-esophageal reflux disease without esophagitis: Secondary | ICD-10-CM | POA: Insufficient documentation

## 2023-06-17 DIAGNOSIS — Z79899 Other long term (current) drug therapy: Secondary | ICD-10-CM | POA: Diagnosis not present

## 2023-06-17 DIAGNOSIS — K644 Residual hemorrhoidal skin tags: Secondary | ICD-10-CM | POA: Diagnosis not present

## 2023-06-17 DIAGNOSIS — Z87891 Personal history of nicotine dependence: Secondary | ICD-10-CM | POA: Insufficient documentation

## 2023-06-17 HISTORY — PX: COLONOSCOPY WITH PROPOFOL: SHX5780

## 2023-06-17 LAB — POCT PREGNANCY, URINE: Preg Test, Ur: NEGATIVE

## 2023-06-17 SURGERY — COLONOSCOPY WITH PROPOFOL
Anesthesia: General

## 2023-06-17 MED ORDER — SODIUM CHLORIDE 0.9 % IV SOLN: INTRAVENOUS | Status: DC

## 2023-06-17 MED ORDER — LIDOCAINE HCL (CARDIAC) PF 100 MG/5ML IV SOSY
PREFILLED_SYRINGE | INTRAVENOUS | Status: DC | PRN
  Administered 2023-06-17: 50 mg via INTRAVENOUS

## 2023-06-17 MED ORDER — PROPOFOL 1000 MG/100ML IV EMUL
INTRAVENOUS | Status: AC
  Filled 2023-06-17: qty 100

## 2023-06-17 MED ORDER — SODIUM CHLORIDE 0.9 % IV SOLN: INTRAVENOUS | Status: DC | PRN

## 2023-06-17 MED ORDER — STERILE WATER FOR IRRIGATION IR SOLN
Status: DC | PRN
  Administered 2023-06-17: 60 mL

## 2023-06-17 MED ORDER — PROPOFOL 500 MG/50ML IV EMUL
INTRAVENOUS | Status: DC | PRN
  Administered 2023-06-17: 150 ug/kg/min via INTRAVENOUS
  Administered 2023-06-17: 80 mg via INTRAVENOUS

## 2023-06-17 NOTE — Telephone Encounter (Signed)
-----   Message from [facility] sent at 06/17/2023  9:16 AM EST ----- Regarding: RE: hemorrhoid banding Next available is fine, we can call her in if there is a cancellation ----- Message ----- From: Denman George, CMA Sent: 06/17/2023   9:12 AM EST To: Toney Reil, MD Subject: RE: hemorrhoid banding                         Next available or do you want me to work her in? ----- Message ----- From: Toney Reil, MD Sent: 06/17/2023   8:55 AM EST To: Denman George, CMA Subject: hemorrhoid banding                             Morrie Sheldon  Please schedule hemorrhoid banding, new pt  RV

## 2023-06-17 NOTE — Anesthesia Postprocedure Evaluation (Signed)
Anesthesia Post Note  Patient: Nancy Donovan  Procedure(s) Performed: COLONOSCOPY WITH PROPOFOL  Patient location during evaluation: Endoscopy Anesthesia Type: General Level of consciousness: awake and alert Pain management: pain level controlled Vital Signs Assessment: post-procedure vital signs reviewed and stable Respiratory status: spontaneous breathing, nonlabored ventilation, respiratory function stable and patient connected to nasal cannula oxygen Cardiovascular status: blood pressure returned to baseline and stable Postop Assessment: no apparent nausea or vomiting Anesthetic complications: no   No notable events documented.   Last Vitals:  Vitals:   06/17/23 0828 06/17/23 0838  BP: 101/81 111/88  Pulse: 76 77  Resp: 18 19  Temp:    SpO2: 97% 98%    Last Pain:  Vitals:   06/17/23 0838  TempSrc:   PainSc: 0-No pain                 Louie Boston

## 2023-06-17 NOTE — Anesthesia Preprocedure Evaluation (Signed)
Anesthesia Evaluation  Patient identified by MRN, date of birth, ID band Patient awake    Reviewed: Allergy & Precautions, NPO status , Patient's Chart, lab work & pertinent test results  History of Anesthesia Complications Negative for: history of anesthetic complications  Airway Mallampati: III  TM Distance: >3 FB Neck ROM: full    Dental no notable dental hx.    Pulmonary neg pulmonary ROS, former smoker   Pulmonary exam normal        Cardiovascular negative cardio ROS Normal cardiovascular exam     Neuro/Psych  PSYCHIATRIC DISORDERS Anxiety Depression    negative neurological ROS     GI/Hepatic Neg liver ROS,GERD  Medicated,,  Endo/Other    Morbid obesity  Renal/GU negative Renal ROS  negative genitourinary   Musculoskeletal   Abdominal   Peds  Hematology negative hematology ROS (+)   Anesthesia Other Findings Past Medical History: No date: Chicken pox No date: Obesity (BMI 30-39.9) No date: Vertigo No date: Vitamin D deficiency  Past Surgical History: 2008: CESAREAN SECTION 2011: CESAREAN SECTION     Comment:  repeat c-seaction 1998: CHOLECYSTECTOMY 2010: DILATION AND CURETTAGE OF UTERUS 2011: DILATION AND CURETTAGE OF UTERUS 1978: EYE SURGERY  BMI    Body Mass Index: 40.85 kg/m      Reproductive/Obstetrics negative OB ROS                             Anesthesia Physical Anesthesia Plan  ASA: 3  Anesthesia Plan: General   Post-op Pain Management: Minimal or no pain anticipated   Induction: Intravenous  PONV Risk Score and Plan: 2 and Propofol infusion and TIVA  Airway Management Planned: Natural Airway and Nasal Cannula  Additional Equipment:   Intra-op Plan:   Post-operative Plan:   Informed Consent: I have reviewed the patients History and Physical, chart, labs and discussed the procedure including the risks, benefits and alternatives for the proposed  anesthesia with the patient or authorized representative who has indicated his/her understanding and acceptance.     Dental Advisory Given  Plan Discussed with: Anesthesiologist, CRNA and Surgeon  Anesthesia Plan Comments: (Patient consented for risks of anesthesia including but not limited to:  - adverse reactions to medications - risk of airway placement if required - damage to eyes, teeth, lips or other oral mucosa - nerve damage due to positioning  - sore throat or hoarseness - Damage to heart, brain, nerves, lungs, other parts of body or loss of life  Patient voiced understanding and assent.)       Anesthesia Quick Evaluation

## 2023-06-17 NOTE — Telephone Encounter (Signed)
Called and left a message for call back  

## 2023-06-17 NOTE — H&P (Signed)
Arlyss Repress, MD [address]  [address]  [place], [place] [address]  Main: [phone]  Fax: [phone] Pager: [phone]  Primary Care Physician:  Glori Luis, MD Primary Gastroenterologist:  Dr. Arlyss Repress  Pre-Procedure History & Physical: HPI:  Nancy Donovan is a 52 y.o. female is here for an colonoscopy.   Past Medical History:  Diagnosis Date   Chicken pox    Obesity (BMI 30-39.9)    Vertigo    Vitamin D deficiency     Past Surgical History:  Procedure Laterality Date   CESAREAN SECTION  2008   CESAREAN SECTION  2011   repeat c-seaction   CHOLECYSTECTOMY  1998   DILATION AND CURETTAGE OF UTERUS  2010   DILATION AND CURETTAGE OF UTERUS  2011   EYE SURGERY  1978    Prior to Admission medications   Medication Sig Start Date End Date Taking? Authorizing Provider  cholecalciferol (VITAMIN D3) 25 MCG (1000 UNIT) tablet Take 1,000 Units by mouth daily.    [provider]  fexofenadine (ALLEGRA) 30 MG tablet Take 30 mg by mouth daily.     [provider]  meclizine (ANTIVERT) 25 MG tablet Take 25 mg by mouth 3 (three) times daily as needed for dizziness (takes 1/2 tab at a time).     [provider]  omeprazole (PRILOSEC) 20 MG capsule Take 20 mg by mouth daily.    [provider]  ondansetron (ZOFRAN) 4 MG tablet Take 1 tablet (4 mg total) by mouth every 6 (six) hours as needed for nausea or vomiting (For Nausea associated with colonoscopy prep). 01/27/23   Toney Reil, MD  Rhubarb (ESTROVEN COMPLETE PO) Take by mouth.    [provider]    Allergies as of 01/27/2023   (No Known Allergies)    Family History  Problem Relation Age of Onset   Uterine cancer Mother 80       Negative Genetic Testing   Hypertension Mother    Diabetes Mother    Hypertension Father     Social History   Socioeconomic History   Marital status: Married    Spouse name: Not on file   Number of children: Not on  file   Years of education: Not on file   Highest education level: Not on file  Occupational History   Not on file  Tobacco Use   Smoking status: Former   Smokeless tobacco: Never  Vaping Use   Vaping status: Never Used  Substance and Sexual Activity   Alcohol use: Yes    Alcohol/week: 0.0 - 1.0 standard drinks of alcohol   Drug use: No   Sexual activity: Yes    Partners: Male    Birth control/protection: Surgical    Comment: Tubal Ligation  Other Topics Concern   Not on file  Social History Narrative   Not on file   Social Determinants of Health   Financial Resource Strain: Not on file  Food Insecurity: Not on file  Transportation Needs: Not on file  Physical Activity: Not on file  Stress: Not on file  Social Connections: Not on file  Intimate Partner Violence: Low Risk  (05/02/2021)   Received from [facility]   Intimate Partner Violence    Insults You: Not on file    Threatens You: Not on file    Screams at You: Not on file    Physically Hurt: Not on file    Intimate Partner Violence Score: Not  on file    Review of Systems: See HPI, otherwise negative ROS  Physical Exam: BP (!) 121/105   Pulse 83   Temp (!) 97.1 F (36.2 C) (Temporal)   Resp 18   Ht 5\' 3"  (1.6 m)   Wt 104.6 kg   SpO2 100%   BMI 40.85 kg/m  General:   Alert,  pleasant and cooperative in NAD Head:  Normocephalic and atraumatic. Neck:  Supple; no masses or thyromegaly. Lungs:  Clear throughout to auscultation.    Heart:  Regular rate and rhythm. Abdomen:  Soft, nontender and nondistended. Normal bowel sounds, without guarding, and without rebound.   Neurologic:  Alert and  oriented x4;  grossly normal neurologically.  Impression/Plan: Nancy Donovan is here for an colonoscopy to be performed for colon cancer screening  Risks, benefits, limitations, and alternatives regarding  colonoscopy have been reviewed with the patient.  Questions have been answered.  All parties  agreeable.   [name], MD  06/17/2023, 7:50 AM

## 2023-06-17 NOTE — Transfer of Care (Signed)
Immediate Anesthesia Transfer of Care Note  Patient: Nancy Donovan  Procedure(s) Performed: COLONOSCOPY WITH PROPOFOL  Patient Location: PACU and Endoscopy Unit  Anesthesia Type:General  Level of Consciousness: drowsy and responds to stimulation  Airway & Oxygen Therapy: Patient Spontanous Breathing  Post-op Assessment: Report given to RN and Post -op Vital signs reviewed and stable  Post vital signs: Reviewed and stable  Last Vitals:  Vitals Value Taken Time  BP    Temp    Pulse    Resp    SpO2      Last Pain:  Vitals:   06/17/23 0656  TempSrc: Temporal  PainSc: 0-No pain         Complications: No notable events documented.

## 2023-06-17 NOTE — Op Note (Signed)
[facility] Gastroenterology Patient Name: Nancy Donovan Procedure Date: 06/17/2023 7:54 AM MRN: [id number] Account #: [id number] Date of Birth: [birth year] Admit Type: Outpatient Age: 52 Room: [facility] ENDO ROOM 1 Gender: Female Note Status: Finalized Instrument Name: Nelda Marseille [id number] Procedure:             Colonoscopy Indications:           Screening for colorectal malignant neoplasm, This is                         the patient's first colonoscopy Providers:             Toney Reil MD, MD Referring MD:          Toney Reil MD, MD (Referring MD), Yehuda Mao.                         Birdie Sons (Referring MD) Medicines:             General Anesthesia Complications:         No immediate complications. Estimated blood loss: None. Procedure:             Pre-Anesthesia Assessment:                        - Prior to the procedure, a History and Physical was                         performed, and patient medications and allergies were                         reviewed. The patient is competent. The risks and                         benefits of the procedure and the sedation options and                         risks were discussed with the patient. All questions                         were answered and informed consent was obtained.                         Patient identification and proposed procedure were                         verified by the physician, the nurse, the                         anesthesiologist, the anesthetist and the technician                         in the pre-procedure area in the procedure room in the                         endoscopy suite. Mental Status Examination: alert and                         oriented. Airway Examination: normal oropharyngeal  airway and neck mobility. Respiratory Examination:                         clear to auscultation. CV Examination: normal.                         Prophylactic Antibiotics:  The patient does not require                         prophylactic antibiotics. Prior Anticoagulants: The                         patient has taken no anticoagulant or antiplatelet                         agents. ASA Grade Assessment: II - A patient with mild                         systemic disease. After reviewing the risks and                         benefits, the patient was deemed in satisfactory                         condition to undergo the procedure. The anesthesia                         plan was to use general anesthesia. Immediately prior                         to administration of medications, the patient was                         re-assessed for adequacy to receive sedatives. The                         heart rate, respiratory rate, oxygen saturations,                         blood pressure, adequacy of pulmonary ventilation, and                         response to care were monitored throughout the                         procedure. The physical status of the patient was                         re-assessed after the procedure.                        After obtaining informed consent, the colonoscope was                         passed under direct vision. Throughout the procedure,                         the patient's blood pressure, pulse, and oxygen  saturations were monitored continuously. The                         Colonoscope was introduced through the anus and                         advanced to the the cecum, identified by appendiceal                         orifice and ileocecal valve. The colonoscopy was                         performed without difficulty. The patient tolerated                         the procedure well. The quality of the bowel                         preparation was evaluated using the BBPS [facility] Bowel                         Preparation Scale) with scores of: Right Colon = 3,                         Transverse Colon = 3  and Left Colon = 3 (entire mucosa                         seen well with no residual staining, small fragments                         of stool or opaque liquid). The total BBPS score                         equals 9. The ileocecal valve, appendiceal orifice,                         and rectum were photographed. Findings:      Hemorrhoids were found on perianal exam.      Non-bleeding external hemorrhoids were found during retroflexion. The       hemorrhoids were large.      The entire examined colon appeared normal. Impression:            - Hemorrhoids found on perianal exam.                        - Non-bleeding external hemorrhoids.                        - The entire examined colon is normal.                        - No specimens collected. Recommendation:        - Discharge patient to home (with escort).                        - Resume previous diet today.                        -  Continue present medications.                        - Repeat colonoscopy in 10 years for screening                         purposes. Procedure Code(s):     --- Professional ---                        B1478, Colorectal cancer screening; colonoscopy on                         individual not meeting criteria for high risk Diagnosis Code(s):     --- Professional ---                        Z12.11, Encounter for screening for malignant neoplasm                         of colon                        K64.4, Residual hemorrhoidal skin tags CPT copyright 2022 American Medical Association. All rights reserved. The codes documented in this report are preliminary and upon coder review may  be revised to meet current compliance requirements. Dr. Libby Maw Toney Reil MD, MD 06/17/2023 8:19:21 AM This report has been signed electronically. Number of Addenda: 0 Note Initiated On: 06/17/2023 7:54 AM Scope Withdrawal Time: 0 hours 8 minutes 19 seconds  Total Procedure Duration: 0 hours 14 minutes 59 seconds   Estimated Blood Loss:  Estimated blood loss: none.      [facility]

## 2023-06-20 ENCOUNTER — Encounter: Payer: Self-pay | Admitting: Gastroenterology

## 2023-06-20 NOTE — Telephone Encounter (Signed)
Called and made appointment for 07/04/2023 3:30

## 2023-07-04 ENCOUNTER — Ambulatory Visit (INDEPENDENT_AMBULATORY_CARE_PROVIDER_SITE_OTHER): Payer: 59 | Admitting: Gastroenterology

## 2023-07-04 ENCOUNTER — Encounter: Payer: Self-pay | Admitting: Gastroenterology

## 2023-07-04 VITALS — BP 125/80 | HR 82 | Temp 97.6°F | Ht 63.0 in | Wt 234.0 lb

## 2023-07-04 DIAGNOSIS — K641 Second degree hemorrhoids: Secondary | ICD-10-CM | POA: Diagnosis not present

## 2023-07-04 DIAGNOSIS — R198 Other specified symptoms and signs involving the digestive system and abdomen: Secondary | ICD-10-CM

## 2023-07-04 DIAGNOSIS — R194 Change in bowel habit: Secondary | ICD-10-CM | POA: Diagnosis not present

## 2023-07-04 NOTE — Progress Notes (Signed)
Arlyss Repress, MD [address]  [address]  [place], [place] [address]  Main: [phone]  Fax: [phone]    Gastroenterology Consultation  Referring Provider:     Glori Luis, MD Primary Care Physician:  Glori Luis, MD Primary Gastroenterologist:  Dr. Arlyss Repress Reason for Consultation: Symptomatic hemorrhoids        HPI:   Nancy Donovan is a 52 y.o. female referred by Dr. Birdie Sons, Yehuda Mao, MD  for consultation & management of symptomatic hemorrhoids.  Patient reports a several years history of hemorrhoidal symptoms including rectal pressure, swelling, itching.  She does report protrusion of hemorrhoidal tissue during flareups.  She has chronic symptoms of alternating constipation, normal bowel movements and diarrhea depending on certain type of foods.  She is trying to eat healthy.  She admits to eating more amount of carbs than normal.  She takes MiraLAX daily which helps with constipation.  She underwent colonoscopy recently for screening, found to have hemorrhoids only.  She denies any family history of GI malignancy  NSAIDs: None  Antiplts/Anticoagulants/Anti thrombotics: None  GI Procedures: Screening colonoscopy 06/2023 - Hemorrhoids found on perianal exam. - Non- bleeding external hemorrhoids. - The entire examined colon is normal. - No specimens collected.  Past Medical History:  Diagnosis Date   Chicken pox    Obesity (BMI 30-39.9)    Vertigo    Vitamin D deficiency     Past Surgical History:  Procedure Laterality Date   CESAREAN SECTION  2008   CESAREAN SECTION  2011   repeat c-seaction   CHOLECYSTECTOMY  1998   COLONOSCOPY WITH PROPOFOL N/A 06/17/2023   Procedure: COLONOSCOPY WITH PROPOFOL;  Surgeon: Toney Reil, MD;  Location: [facility] ENDOSCOPY;  Service: Gastroenterology;  Laterality: N/A;   DILATION AND CURETTAGE OF UTERUS  2010   DILATION AND CURETTAGE OF UTERUS  2011   EYE SURGERY  1978     Current Outpatient  Medications:    cholecalciferol (VITAMIN D3) 25 MCG (1000 UNIT) tablet, Take 1,000 Units by mouth daily., Disp: , Rfl:    fexofenadine (ALLEGRA) 30 MG tablet, Take 30 mg by mouth daily. , Disp: , Rfl:    meclizine (ANTIVERT) 25 MG tablet, Take 25 mg by mouth 3 (three) times daily as needed for dizziness (takes 1/2 tab at a time). , Disp: , Rfl:    omeprazole (PRILOSEC) 20 MG capsule, Take 20 mg by mouth daily., Disp: , Rfl:    Rhubarb (ESTROVEN COMPLETE PO), Take by mouth., Disp: , Rfl:    Family History  Problem Relation Age of Onset   Uterine cancer Mother 12       Negative Genetic Testing   Hypertension Mother    Diabetes Mother    Hypertension Father      Social History   Tobacco Use   Smoking status: Former   Smokeless tobacco: Never  Vaping Use   Vaping status: Never Used  Substance Use Topics   Alcohol use: Yes    Alcohol/week: 0.0 - 1.0 standard drinks of alcohol   Drug use: No    Allergies as of 07/04/2023   (No Known Allergies)    Review of Systems:    All systems reviewed and negative except where noted in HPI.   Physical Exam:  BP 125/80 (BP Location: Right Arm, Patient Position: Sitting, Cuff Size: Large)   Temp 97.6 F (36.4 C) (Oral)   Ht 5\' 3"  (1.6 m)   Wt 234 lb (  106.1 kg)   BMI 41.45 kg/m  No LMP recorded.  General:   Alert,  Well-developed, well-nourished, pleasant and cooperative in NAD Head:  Normocephalic and atraumatic. Eyes:  Sclera clear, no icterus.   Conjunctiva pink. Ears:  Normal auditory acuity. Nose:  No deformity, discharge, or lesions. Mouth:  No deformity or lesions,oropharynx pink & moist. Neck:  Supple; no masses or thyromegaly. Lungs:  Respirations even and unlabored.  Clear throughout to auscultation.   No wheezes, crackles, or rhonchi. No acute distress. Heart:  Regular rate and rhythm; no murmurs, clicks, rubs, or gallops. Abdomen:  Normal bowel sounds. Soft, non-tender and non-distended without masses, hepatosplenomegaly  or hernias noted.  No guarding or rebound tenderness.   Rectal: Right posterior nontender thrombosed prolapsed hemorrhoid with organized clot, manually reducible, nontender digital rectal exam Msk:  Symmetrical without gross deformities. Good, equal movement & strength bilaterally. Pulses:  Normal pulses noted. Extremities:  No clubbing or edema.  No cyanosis. Neurologic:  Alert and oriented x3;  grossly normal neurologically. Skin:  Intact without significant lesions or rashes. No jaundice. Psych:  Alert and cooperative. Normal mood and affect.  Imaging Studies: No abdominal imaging  Assessment and Plan:   Nancy Donovan is a 52 y.o. female with grade 1-2 symptomatic external hemorrhoids.  She has tried several hemorrhoidal creams with temporary relief only.  She is interested in outpatient hemorrhoid ligation.  She has alternating episodes of constipation and diarrhea  Discussed about healthy eating habits Add supplemental fiber along with dietary fiber Adequate intake of water Take MiraLAX daily Discussed about hemorrhoid banding, procedure, risks and benefits Consent obtained Proceed with hemorrhoid ligation today   Follow up in 3 to 4 weeks   Arlyss Repress, MD

## 2023-07-04 NOTE — Progress Notes (Signed)
PROCEDURE NOTE: The patient presents with symptomatic grade 2 hemorrhoids, unresponsive to maximal medical therapy, requesting rubber band ligation of his/her hemorrhoidal disease.  All risks, benefits and alternative forms of therapy were described and informed consent was obtained.   The decision was made to band the RP internal hemorrhoid, and the [facility] O'Regan System was used to perform band ligation without complication.  Digital anorectal examination was then performed to assure proper positioning of the band, and to adjust the banded tissue as required.  The patient was discharged home without pain or other issues.  Dietary and behavioral recommendations were given and (if necessary - prescriptions were given), along with follow-up instructions.  The patient will return 3 weeks for follow-up and possible additional banding as required.  No complications were encountered and the patient tolerated the procedure well.

## 2023-08-17 ENCOUNTER — Ambulatory Visit: Payer: 59 | Admitting: Gastroenterology

## 2023-11-01 ENCOUNTER — Telehealth: Payer: Self-pay | Admitting: Family Medicine

## 2023-11-01 NOTE — Telephone Encounter (Signed)
 Dr Birdie Sons is no longer at this location. Please call the office to schedule a Transfer of Care to either Dr Charlann Lange, Darleen Crocker or Kara Dies, NP   Thank you  [id number], please schedule a TOC visit for this patient. [facility]

## 2023-12-28 ENCOUNTER — Encounter: Payer: 59 | Admitting: Family Medicine
